# Patient Record
Sex: Male | Born: 1965 | Race: White | Hispanic: No | Marital: Married | Smoking: Never smoker
Health system: Southern US, Community
[De-identification: ages and names within clinical notes are randomized; demographics above are authoritative.]

## PROBLEM LIST (undated history)

## (undated) DIAGNOSIS — I10 Essential (primary) hypertension: Secondary | ICD-10-CM

## (undated) DIAGNOSIS — G35 Multiple sclerosis: Secondary | ICD-10-CM

## (undated) DIAGNOSIS — G969 Disorder of central nervous system, unspecified: Secondary | ICD-10-CM

## (undated) DIAGNOSIS — D8689 Sarcoidosis of other sites: Secondary | ICD-10-CM

## (undated) DIAGNOSIS — E785 Hyperlipidemia, unspecified: Secondary | ICD-10-CM

## (undated) DIAGNOSIS — N529 Male erectile dysfunction, unspecified: Secondary | ICD-10-CM

## (undated) DIAGNOSIS — F329 Major depressive disorder, single episode, unspecified: Secondary | ICD-10-CM

## (undated) DIAGNOSIS — E291 Testicular hypofunction: Secondary | ICD-10-CM

## (undated) DIAGNOSIS — R251 Tremor, unspecified: Secondary | ICD-10-CM

## (undated) DIAGNOSIS — F32A Depression, unspecified: Secondary | ICD-10-CM

## (undated) DIAGNOSIS — M1990 Unspecified osteoarthritis, unspecified site: Secondary | ICD-10-CM

## (undated) DIAGNOSIS — G4730 Sleep apnea, unspecified: Secondary | ICD-10-CM

## (undated) DIAGNOSIS — K5909 Other constipation: Secondary | ICD-10-CM

## (undated) DIAGNOSIS — A078 Other specified protozoal intestinal diseases: Secondary | ICD-10-CM

## (undated) HISTORY — DX: Sleep apnea, unspecified: G47.30

## (undated) HISTORY — DX: Hyperlipidemia, unspecified: E78.5

## (undated) HISTORY — DX: Male erectile dysfunction, unspecified: N52.9

## (undated) HISTORY — DX: Depression, unspecified: F32.A

## (undated) HISTORY — DX: Major depressive disorder, single episode, unspecified: F32.9

## (undated) HISTORY — DX: Other constipation: K59.09

## (undated) HISTORY — DX: Morbid (severe) obesity due to excess calories: E66.01

## (undated) HISTORY — DX: Sarcoidosis of other sites: D86.89

## (undated) HISTORY — DX: Multiple sclerosis: G35

## (undated) HISTORY — DX: Testicular hypofunction: E29.1

## (undated) HISTORY — DX: Essential (primary) hypertension: I10

---

## 1997-12-05 HISTORY — PX: OTHER SURGICAL HISTORY: SHX169

## 2005-04-19 ENCOUNTER — Ambulatory Visit: Payer: Self-pay | Admitting: Internal Medicine

## 2005-05-23 ENCOUNTER — Ambulatory Visit: Payer: Self-pay | Admitting: Nurse Practitioner

## 2005-12-05 HISTORY — PX: TONSILLECTOMY: SUR1361

## 2006-03-15 ENCOUNTER — Ambulatory Visit: Payer: Self-pay | Admitting: Specialist

## 2006-04-20 ENCOUNTER — Ambulatory Visit (HOSPITAL_COMMUNITY): Admission: RE | Admit: 2006-04-20 | Discharge: 2006-04-20 | Payer: Self-pay | Admitting: Preventative Medicine

## 2006-04-27 ENCOUNTER — Ambulatory Visit: Payer: Self-pay | Admitting: Family Medicine

## 2006-05-11 ENCOUNTER — Other Ambulatory Visit: Payer: Self-pay

## 2006-05-19 ENCOUNTER — Ambulatory Visit: Payer: Self-pay | Admitting: Unknown Physician Specialty

## 2006-09-20 ENCOUNTER — Ambulatory Visit: Payer: Self-pay | Admitting: Psychiatry

## 2010-07-30 ENCOUNTER — Encounter: Admission: RE | Admit: 2010-07-30 | Discharge: 2010-07-30 | Payer: Self-pay | Admitting: Neurology

## 2011-12-06 HISTORY — PX: LUNG BIOPSY: SHX232

## 2011-12-22 ENCOUNTER — Ambulatory Visit: Payer: Self-pay | Admitting: Gastroenterology

## 2011-12-23 LAB — PATHOLOGY REPORT

## 2012-03-19 ENCOUNTER — Ambulatory Visit: Payer: Self-pay | Admitting: Oncology

## 2012-04-04 ENCOUNTER — Ambulatory Visit: Payer: Self-pay | Admitting: Oncology

## 2012-04-12 ENCOUNTER — Ambulatory Visit: Payer: Self-pay

## 2012-04-12 LAB — COMPREHENSIVE METABOLIC PANEL
Albumin: 4.1 g/dL (ref 3.4–5.0)
Alkaline Phosphatase: 63 U/L (ref 50–136)
Anion Gap: 7 (ref 7–16)
Calcium, Total: 9.7 mg/dL (ref 8.5–10.1)
EGFR (African American): >60
EGFR (Non-African Amer.): >60
Glucose: 102 mg/dL — ABNORMAL HIGH (ref 65–99)
Osmolality: 279 (ref 275–301)
Potassium: 3.8 mmol/L (ref 3.5–5.1)
SGPT (ALT): 82 U/L — ABNORMAL HIGH
Sodium: 139 mmol/L (ref 136–145)

## 2012-04-12 LAB — CBC CANCER CENTER
Basophil #: 0 x10 3/mm (ref 0.0–0.1)
MCH: 29.7 pg (ref 26.0–34.0)
MCHC: 33.1 g/dL (ref 32.0–36.0)
Monocyte %: 12.8 %
Neutrophil #: 3.5 x10 3/mm (ref 1.4–6.5)
Neutrophil %: 65.1 %
RDW: 13 % (ref 11.5–14.5)
WBC: 5.4 x10 3/mm (ref 3.8–10.6)

## 2012-04-12 LAB — APTT: Activated PTT: 28.3 secs (ref 23.6–35.9)

## 2012-04-12 LAB — PROTIME-INR: Prothrombin Time: 12.9 secs (ref 11.5–14.7)

## 2012-04-16 ENCOUNTER — Inpatient Hospital Stay: Payer: Self-pay

## 2012-04-17 LAB — CBC WITH DIFFERENTIAL/PLATELET
Basophil #: 0 10*3/uL (ref 0.0–0.1)
HGB: 14.2 g/dL (ref 13.0–18.0)
Lymphocyte #: 0.9 10*3/uL — ABNORMAL LOW (ref 1.0–3.6)
MCHC: 33 g/dL (ref 32.0–36.0)
MCV: 91 fL (ref 80–100)
Monocyte %: 10.8 %
Neutrophil #: 7.8 10*3/uL — ABNORMAL HIGH (ref 1.4–6.5)
Neutrophil %: 79.1 %
RDW: 13 % (ref 11.5–14.5)

## 2012-04-20 LAB — WOUND CULTURE

## 2012-05-05 ENCOUNTER — Ambulatory Visit: Payer: Self-pay | Admitting: Oncology

## 2012-05-07 LAB — CULTURE, FUNGUS WITHOUT SMEAR

## 2012-06-04 ENCOUNTER — Ambulatory Visit: Payer: Self-pay | Admitting: Oncology

## 2012-10-04 ENCOUNTER — Ambulatory Visit: Payer: Self-pay | Admitting: Oncology

## 2012-10-18 ENCOUNTER — Ambulatory Visit: Payer: Self-pay | Admitting: Oncology

## 2012-11-04 ENCOUNTER — Ambulatory Visit: Payer: Self-pay | Admitting: Oncology

## 2012-12-06 ENCOUNTER — Other Ambulatory Visit: Payer: Self-pay | Admitting: Psychiatry

## 2012-12-06 DIAGNOSIS — G35 Multiple sclerosis: Secondary | ICD-10-CM

## 2012-12-06 DIAGNOSIS — G959 Disease of spinal cord, unspecified: Secondary | ICD-10-CM

## 2012-12-06 DIAGNOSIS — D869 Sarcoidosis, unspecified: Secondary | ICD-10-CM

## 2012-12-14 ENCOUNTER — Ambulatory Visit
Admission: RE | Admit: 2012-12-14 | Discharge: 2012-12-14 | Disposition: A | Discharge status: Home / Self Care | Payer: Medicare Other | Source: Ambulatory Visit | Attending: Psychiatry | Admitting: Psychiatry

## 2012-12-14 DIAGNOSIS — G35 Multiple sclerosis: Secondary | ICD-10-CM

## 2012-12-14 DIAGNOSIS — G959 Disease of spinal cord, unspecified: Secondary | ICD-10-CM

## 2012-12-14 DIAGNOSIS — D869 Sarcoidosis, unspecified: Secondary | ICD-10-CM

## 2012-12-14 MED ORDER — GADOBENATE DIMEGLUMINE 529 MG/ML IV SOLN
20.0000 mL | Freq: Once | INTRAVENOUS | Status: AC | PRN
  Administered 2012-12-14: 20 mL via INTRAVENOUS

## 2013-02-08 ENCOUNTER — Emergency Department: Payer: Self-pay | Admitting: Emergency Medicine

## 2013-11-11 ENCOUNTER — Other Ambulatory Visit: Payer: Self-pay | Admitting: Specialist

## 2013-11-11 DIAGNOSIS — M5416 Radiculopathy, lumbar region: Secondary | ICD-10-CM

## 2013-11-22 ENCOUNTER — Other Ambulatory Visit: Payer: Medicare Other

## 2013-11-30 ENCOUNTER — Ambulatory Visit
Admission: RE | Admit: 2013-11-30 | Discharge: 2013-11-30 | Disposition: A | Discharge status: Home / Self Care | Payer: Medicare Other | Source: Ambulatory Visit | Attending: Specialist | Admitting: Specialist

## 2013-11-30 DIAGNOSIS — M5416 Radiculopathy, lumbar region: Secondary | ICD-10-CM

## 2013-12-05 HISTORY — PX: OTHER SURGICAL HISTORY: SHX169

## 2014-04-22 ENCOUNTER — Other Ambulatory Visit: Payer: Self-pay | Admitting: Nurse Practitioner

## 2014-04-22 DIAGNOSIS — M1990 Unspecified osteoarthritis, unspecified site: Secondary | ICD-10-CM | POA: Insufficient documentation

## 2014-04-22 DIAGNOSIS — M25562 Pain in left knee: Secondary | ICD-10-CM

## 2014-04-22 DIAGNOSIS — S8990XA Unspecified injury of unspecified lower leg, initial encounter: Secondary | ICD-10-CM | POA: Insufficient documentation

## 2014-05-02 ENCOUNTER — Ambulatory Visit
Admission: RE | Admit: 2014-05-02 | Discharge: 2014-05-02 | Disposition: A | Discharge status: Home / Self Care | Payer: Commercial Managed Care - HMO | Source: Ambulatory Visit | Attending: Nurse Practitioner | Admitting: Nurse Practitioner

## 2014-05-02 DIAGNOSIS — M25562 Pain in left knee: Secondary | ICD-10-CM

## 2014-06-13 DIAGNOSIS — D8689 Sarcoidosis of other sites: Secondary | ICD-10-CM | POA: Insufficient documentation

## 2014-06-13 DIAGNOSIS — G4733 Obstructive sleep apnea (adult) (pediatric): Secondary | ICD-10-CM | POA: Insufficient documentation

## 2014-06-16 ENCOUNTER — Ambulatory Visit: Payer: Self-pay | Admitting: Orthopedic Surgery

## 2014-06-19 ENCOUNTER — Ambulatory Visit: Payer: Self-pay | Admitting: Orthopedic Surgery

## 2015-03-28 NOTE — Op Note (Signed)
PATIENT NAME:  Darren Taylor, Darren Taylor MR#:  [id number] DATE OF BIRTH:  27-Mar-1966  DATE OF PROCEDURE:  06/19/2014  PREOPERATIVE DIAGNOSIS: Left knee medial meniscus tear.   POSTOPERATIVE DIAGNOSIS: Left knee medial meniscus tear with medial compartment arthritis.   PROCEDURE: Arthroscopy, left knee with partial medial meniscectomy.   ANESTHESIA: General.   SURGEON: Hessie Knows, M.D.   DESCRIPTION OF PROCEDURE: The patient was brought to the Operating Room, and after adequate anesthesia was obtained, the left leg was placed in the arthroscopic leg holder with the leg prepped and draped in the usual sterile fashion. After patient identification and timeout procedures were completed, an inferolateral portal was made and the arthroscope was introduced. Initial inspection revealed some mild central chondromalacia of the patella. The medial and lateral gutters were free of any loose body. Coming out of the medial compartment, an inferomedial portal was made and a probe introduced.  There was some articular full thickness cartilage loss on the femoral condyle with fissuring over an extensive area. There was a flap tear of the posterior horn that could be displaced into the joint along with a complex tear involving the posterior third of the meniscus. This was debrided back with a meniscal punch followed by an ArthroCare wand. The ACL was intact and the lateral compartment was essentially normal. It was a very small area of grade I chondromalacia on the tibial condyle. After thorough irrigation of the joint, all instrumentation was withdrawn. The wounds were closed with simple interrupted 4-0 nylon,  20 mL of 0.5% Sensorcaine with epinephrine infiltrated into get into the area of the portals for postoperative analgesia. Xeroform, 4 x 4's, Webril and Ace wrap were applied. The patient was sent to the recovery room in stable condition.   ESTIMATED BLOOD LOSS: Minimal.   COMPLICATIONS: None.   SPECIMEN:  None.  Pre- and postprocedure arthroscopic pictures were obtained.   ____________________________ Laurene Footman, MD mjm:ts D: 06/19/2014 17:53:42 ET T: 06/19/2014 18:46:24 ET JOB#: [id number]  cc: Laurene Footman, MD, <Dictator> Laurene Footman MD ELECTRONICALLY SIGNED 06/23/2014 22:30

## 2015-03-29 NOTE — Discharge Summary (Signed)
PATIENT NAME:  [name], [name] MR#:  [id number] DATE OF BIRTH:  December 27, 1965  DATE OF ADMISSION:  04/16/2012 DATE OF DISCHARGE:  04/18/2012  ADMITTING DIAGNOSIS: Mediastinal lymphadenopathy.   DISCHARGE DIAGNOSIS: Mediastinal lymphadenopathy.  OPERATION PERFORMED: Left anterior thoracotomy with biopsy and mediastinal nodes.   HOSPITAL COURSE: Mr. [name] is a 49 year old gentleman who was recently found to have extensive mediastinal adenopathy. He had a CT scan revealing marked enlargement of the mediastinal nodes and there was some concern that these may represent a malignancy. Because of this he was offered left anterior thoracotomy and biopsy. He was admitted to the hospital on 04/16/2012 where he underwent this procedure. The next day his chest tube was removed and the following day he was felt to be suitable for discharge. Because of his multiple sclerosis, he did have some issues with ambulation and rehabilitation. By the second day postoperatively he was felt ready for discharge.   DISCHARGE MEDICATIONS: His discharge medications included all of the same home medications plus he was given a prescription for Percocet to take as needed.   FOLLOW-UP: He will follow-up with me in one week in the [facility].   PATHOLOGY: At the time of discharge pathology is still pending.   ____________________________ Lew Dawes. Genevive Bi, MD teo:drc D: 04/19/2012 11:57:14 ET T: 04/19/2012 12:05:12 ET JOB#: [id number]  cc: Christia Reading E. Genevive Bi, MD, <Dictator> Louis Matte MD ELECTRONICALLY SIGNED 04/24/2012 8:21

## 2015-03-29 NOTE — Consult Note (Signed)
History of Present Illness:   Reason for Consult Abnormal CT scan of the chest and abdomen (March 02, 2012) findings suggestive of 3 mm right middle lobe nodule.  1.2 cm left articular lymph node.  1.4 cm portocaval lymph node.  1 cm right iliac lymph node.  CT scan of the chest (the 5th April, 2013) numerous enlarged subcentimeter lymph node along with multiple mediastinal lymph node.  1.2 cm right supraclavicular lymph node 1.4 cm precarinal lymph node.  Multiple bilateral pulmonary nodules.    Date of Diagnosis 09-Mar-2012    HPI   49 year old moderately obese  individual started having rectal bleeding.  Patient had  fissure  was evaluated by gastroenterology CT scan of the abdomen was ordered.  Had a colonoscopy and according to him polyp in the colon was found.Marland Kitchenscan of abdomen and was abnormal with portacaval and peripancreatic lymph node CT scan of the chest was ordered which revealed multiple pulmonary nodules and multiple mediastinal lymphadenopathy.  Does not have any fever.  Has dry hacking cough.  Patient does not smoke.  Has multiple sclerosis diagnosed approximately year ago and is getting  COPAXON .   Patient is extremely apprehensive and anxious  PFSH:   Comments Family history of heart disease and prostate cancer    Comments Patient does not smoke.  Was working  [employer].   Does not smoke.  Does not drink.    Additional Past Medical and Surgical History Multiple sclerosis.   Hypertension.   Sleep apnea  Hyperlipidemia   Coronary artery disease   Review of Systems:   General weakness  fatigue    Performance Status (ECOG) 1    HEENT Had surgery for sleep apnea (uvuloplasty)    Lungs cough  SOB    Cardiac no complaints    GI Rectal bleeding    GU no complaints    Musculoskeletal muscle ache    Extremities no complaints    Skin no complaints    Neuro History of multiple sclerosis    Endocrine no complaints    Psych anxiety    Review of  Systems   as per history of present illness  NURSING NOTES: *CC Vital Signs Flowsheet:   15-Apr-13 14:58    Temp Temperature: 98.4    Pulse Pulse: 71    Respirations Respirations: 20    SBP SBP: 121    DBP DBP: 78    Pain Scale (0-10): 6    Current Weight (kg) (kg): 150.8    Height (cm) centimeters: 184.4    BSA (m2): 2.6   Physical Exam:   General Patient is alert and oriented extremely apprehensive    HEENT: Uvuloplasty    Lungs: clear    Cardiac: regular rate, rhythm    Abdomen: soft  nontender  positive bowel sounds    Skin: intact    Musculoskeletal: No abnormality detected    Extremities: No edema, rash or cyanosis    Neuro: AAOx3  cranial nerves intact    Psych: mood agitated    Physical Exam LYMPHATICS:   No cervical, axillary, or inguinal lymphadenopathy     chronic constipation:    Multiple Sclerosis: 2012   Depression:    Sleep Apnea:    Hypercholesterolemia:    Hypertension:    hyperten:    Hyperlipidemia:    finger surgery:    forearm surgery:    No Known Allergies:     hydrochlorothiazide-lisinopril 12.5 mg-20 mg oral tablet: 1 tab(s) orally once a day,  Active, 0, None   stool softener:   , As Needed- for Constipation , Active, 0, None   Metamucil 3.4 g/5.2 g oral powder for reconstitution:  orally 2-3 times daily, Active, 0, None   alprazolam 0.5 mg oral tablet: 1 tab(s) orally , As Needed, Active, 0, None   albuterol:  orally , As Needed, Active, 0, None   Copaxone 20 mg/mL subcutaneous kit: 1 kit subcutaneous once a day, Active, 0, None   Pristiq 100 mg oral tablet, extended release: 1 tab(s) orally once a day, Active, 0, None   gabapentin 300 mg oral capsule: 1 cap(s) orally 2 times a day, Active, 0, None   MiraLax oral powder for reconstitution: 1 dose(s) orally once a day, Active, 0, None   multivitamin: 1 cap(s) orally once a day, Active, 0, None   nitroglycerin:  topically 2 times a day, Active, 0,  None   topiramate 25 mg oral tablet: 1 tab(s) orally 2 times a day, As Needed- for Headache , Active, 0, None   trazodone 100 mg oral tablet: 2 tab(s) orally once a day (at bedtime), Active, 0, None  Assessment and Plan:  Impression:   [age] year old gentleman had a CT scan of chest abdomen and pelvis revealing multiple enlarged lymph node and multiple pulmonary nodules.  Most and lymph nodes are less than 2 cm and subcentimeter Etiology could be sarcoidosis versus lymphoma I have ordered ACE.if it is negative then PET scan and biopsy can be arranged patient is going to be seen by Dr. Faith Rogue, cardiothoracic surgeon. Has multiple sclerosis on Copaxone. Multiple other medical illness including sleep apnea syndrome: Obesity: Hypercholesterolemia, hypertensionafter ACE blood test  is available  CC Referral:   cc: Dr. Maryland Pink.  Ebony Cargo.  NP  gastroenterology Department, [facility]   Electronic Signatures: Jobe Gibbon (MD)  (Signed 15-Apr-13 17:07)  Authored: HISTORY OF PRESENT ILLNESS, PFSH, ROS, NURSING NOTES, PE, PAST MEDICAL HISTORY, ALLERGIES, HOME MEDICATIONS, ASSESSMENT AND PLAN, CC Referring Physician   Last Updated: 15-Apr-13 17:07 by Jobe Gibbon (MD)

## 2015-03-29 NOTE — Op Note (Signed)
PATIENT NAME:  Darren Taylor, Darren Taylor MR#:  [id number] DATE OF BIRTH:  10-Jun-1966  DATE OF PROCEDURE:  04/16/2012  PREOPERATIVE DIAGNOSIS: Mediastinal lymphadenopathy.   POSTOPERATIVE DIAGNOSIS: Mediastinal lymphadenopathy.   OPERATION PERFORMED:  1. Preoperative bronchoscopy to assess endobronchial anatomy.  2. Left anterior thoracotomy (Chamberlain procedure) with biopsy of mediastinal lymph nodes.   SURGEON: Bryson Palen E. Genevive Bi, MD   ASSISTANT: Rodena Goldmann, III, MD   INDICATIONS FOR PROCEDURE: [name] is a 49 year old gentleman who was recently discovered to have extensive mediastinal adenopathy. He also carries a diagnosis of multiple sclerosis, and he was offered the above-named procedure for definitive diagnosis. The indications and risks were explained to the patient, who gave his informed consent.   DESCRIPTION OF PROCEDURE: The patient was brought to the operating suite and placed in the supine position. General endotracheal anesthesia was given through a double-lumen tube. Preoperative bronchoscopy was carried out. There was no evidence of endobronchial tumor or mass. The mucosa looked normal. There was no studding or other abnormalities of the mucosa. The patient was then positioned supine for an anterior thoracotomy. The patient was prepped and draped in the usual sterile fashion. An incision was made overlying the second rib and carried down through the muscles of the chest wall until the second rib was identified. A portion of the second rib was removed allowing [place] good access to the mediastinum. We opened the mediastinal pleura and could palpate several very firm lymph nodes within the mediastinum. These were grasped with an Allis clamp and elevated out of the mediastinal fat. A portion of these nodes were removed. There were actually two lymph nodes each measuring about a centimeter in size. One was sent for frozen section and flow cytometry. A portion of one node was sent for culture.  Hemostasis was complete. Frozen section returned probable granulomatous inflammation. The chest was drained with a #19 Blake. It was brought out through a separate stab wound laterally. The wounds were then closed in multiple layers of running absorbable sutures. The muscles, subcutaneous tissues and skin were all closed. Benzoin and Steri-Strips completed the case.  The patient was then awakened from general endotracheal anesthesia and taken to the recovery room in stable condition.    ____________________________ Lew Dawes Genevive Bi, MD teo:cbb D: 04/16/2012 09:21:26 ET T: 04/16/2012 15:52:09 ET JOB#: [id number]  cc: Christia Reading E. Genevive Bi, MD, <Dictator> Rodena Goldmann III, MD Martie Lee. Oliva Bustard, MD Louis Matte MD ELECTRONICALLY SIGNED 04/24/2012 8:21

## 2015-09-22 ENCOUNTER — Ambulatory Visit: Payer: Self-pay | Admitting: Urology

## 2015-10-09 ENCOUNTER — Encounter: Payer: Self-pay | Admitting: Urology

## 2015-10-09 ENCOUNTER — Ambulatory Visit (INDEPENDENT_AMBULATORY_CARE_PROVIDER_SITE_OTHER): Payer: Commercial Managed Care - HMO | Admitting: Urology

## 2015-10-09 VITALS — BP 130/82 | HR 80 | Ht 75.0 in | Wt 316.9 lb

## 2015-10-09 DIAGNOSIS — F524 Premature ejaculation: Secondary | ICD-10-CM

## 2015-10-09 DIAGNOSIS — N521 Erectile dysfunction due to diseases classified elsewhere: Secondary | ICD-10-CM | POA: Diagnosis not present

## 2015-10-09 NOTE — Progress Notes (Signed)
Patient well known to me. He is on Lexapro for premature ejaculation is able to [facility] for 15 minutes. He is also on sildenafil 50 mg she takes 3 when he had presented. Plan to maintain this. 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 He recently had a bout of knee pain from this problem. 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 He was put on acyclovir for this lung breath other than that he is happy with his sex life now and his going to have visit with [place] in 6 months for removal of medication and rectal exam were his prostate along with a PSA.

## 2016-01-21 DIAGNOSIS — E782 Mixed hyperlipidemia: Secondary | ICD-10-CM | POA: Insufficient documentation

## 2016-01-21 DIAGNOSIS — N529 Male erectile dysfunction, unspecified: Secondary | ICD-10-CM | POA: Insufficient documentation

## 2016-01-21 DIAGNOSIS — I10 Essential (primary) hypertension: Secondary | ICD-10-CM | POA: Insufficient documentation

## 2016-03-15 ENCOUNTER — Other Ambulatory Visit: Payer: Self-pay

## 2016-03-15 DIAGNOSIS — F524 Premature ejaculation: Secondary | ICD-10-CM

## 2016-03-15 MED ORDER — ESCITALOPRAM OXALATE 10 MG PO TABS: 10.0000 mg | ORAL_TABLET | Freq: Every day | ORAL | Status: DC

## 2016-04-22 ENCOUNTER — Ambulatory Visit: Payer: Commercial Managed Care - HMO

## 2016-05-18 ENCOUNTER — Ambulatory Visit: Payer: Commercial Managed Care - HMO | Admitting: Urology

## 2016-06-24 ENCOUNTER — Encounter: Payer: Self-pay | Admitting: Urology

## 2016-06-24 ENCOUNTER — Ambulatory Visit (INDEPENDENT_AMBULATORY_CARE_PROVIDER_SITE_OTHER): Payer: Medicare PPO | Admitting: Urology

## 2016-06-24 VITALS — BP 133/84 | HR 71 | Ht 75.0 in | Wt 326.0 lb

## 2016-06-24 DIAGNOSIS — N521 Erectile dysfunction due to diseases classified elsewhere: Secondary | ICD-10-CM | POA: Diagnosis not present

## 2016-06-24 DIAGNOSIS — R6882 Decreased libido: Secondary | ICD-10-CM

## 2016-06-24 DIAGNOSIS — N471 Phimosis: Secondary | ICD-10-CM

## 2016-06-24 DIAGNOSIS — F524 Premature ejaculation: Secondary | ICD-10-CM

## 2016-06-24 NOTE — Progress Notes (Signed)
06/24/2016 8:37 AM   Darren Taylor 04/23/1966 [id number]  Referring provider: Maryland Pink, MD [address] [facility] [place], [place] [address]  Chief Complaint  Patient presents with  . Erectile Dysfunction    11month    HPI: 50 year old male previously followed by Dr. Elnoria Howard for erectile dysfunction and premature ejaculation.  He has multiple medical comorbidities including neurogenic sarcoidosis and depression.  In terms of premature ejaculation, he is taking Lexapro daily for this and has had fairly decent success with this.  He is currently using sildenafil 80 mg (generic) which works OK but not to his complete satisfaction. He is able to achieve an erection with this most of the time that often not maintained. He also is concerned about the limitations of spontaneity with Viagra and must plan to take this medication prior to a sexual encounter. Reliability of the medication is variable. He does try to take it on an empty stomach at least 1 hour prior sexual activity.  He is anxious for Cialis to come out as a generic so that it is more affordable.    He also complains today that his interest in sexual activity is lower at times. He had his testosterone checked in the past and this was presumably normal per his report.  He is also concerned today about extra skin on his penis which sometimes rolls forward and causes straying with urination.            [place] Name 06/24/16 0943         SHIM: Over the last 6 months:   How do you rate your confidence that you could get and keep an erection? Low     When you had erections with sexual stimulation, how often were your erections hard enough for penetration (entering your partner)? Sometimes (about half the time)     During sexual intercourse, how often were you able to maintain your erection after you had penetrated (entered) your partner? Difficult     During sexual intercourse, how difficult was it to maintain  your erection to completion of intercourse? Slightly Difficult     When you attempted sexual intercourse, how often was it satisfactory for you? Slightly Difficult       SHIM Total Score   SHIM 16         PMH: Past Medical History:  Diagnosis Date  . Chronic constipation   . Depression   . ED (erectile dysfunction)   . Hyperlipemia   . Hypertension   . Hypogonadism in male   . Morbid obesity ([place])   . MS (multiple sclerosis) ([place])   . Neurosarcoidosis ([place])   . Sleep apnea     Surgical History: Past Surgical History:  Procedure Laterality Date  . Arm Fracture Repair  1999  . LUNG BIOPSY  2013  . Repair of Meniscus  2015  . TONSILLECTOMY  2007    Home Medications:    Medication List       Accurate as of 06/24/16 11:59 PM. Always use your most recent med list.          ALPRAZolam 0.5 MG tablet Commonly known as:  XANAX   buPROPion 300 MG 24 hr tablet Commonly known as:  WELLBUTRIN XL   escitalopram 10 MG tablet Commonly known as:  LEXAPRO Take 1 tablet (10 mg total) by mouth daily.   gabapentin 300 MG capsule Commonly known as:  NEURONTIN   HYDROcodone-acetaminophen 5-325 MG  tablet Commonly known as:  NORCO/VICODIN Take by mouth.   hydroxychloroquine 200 MG tablet Commonly known as:  PLAQUENIL   levETIRAcetam 500 MG tablet Commonly known as:  KEPPRA Take 500 mg by mouth 2 (two) times daily.   lisinopril-hydrochlorothiazide 20-25 MG tablet Commonly known as:  PRINZIDE,ZESTORETIC TAKE 1 TABLET TWICE DAILY   methylphenidate 20 MG tablet Commonly known as:  RITALIN   PROAIR HFA 108 (90 Base) MCG/ACT inhaler Generic drug:  albuterol Inhale into the lungs.   sildenafil 20 MG tablet Commonly known as:  REVATIO TAKE 2-5 TABLETS (40-100 MG TOTAL) BY MOUTH AS NEEDED.   simvastatin 20 MG tablet Commonly known as:  ZOCOR   temazepam 15 MG capsule Commonly known as:  RESTORIL Take 15 mg by mouth.   traMADol 50 MG tablet Commonly known as:   ULTRAM   traZODone 100 MG tablet Commonly known as:  DESYREL Take by mouth.       Allergies: No Known Allergies  Family History: Family History  Problem Relation Age of Onset  . Bladder Cancer Neg Hx   . Prostate cancer Neg Hx   . Heart disease Mother     Social History:  reports that he has never smoked. He does not have any smokeless tobacco history on file. He reports that he drinks alcohol. He reports that he does not use drugs.  ROS: UROLOGY Frequent Urination?: No Hard to postpone urination?: No Burning/pain with urination?: No Get up at night to urinate?: No Leakage of urine?: No Urine stream starts and stops?: No Trouble starting stream?: No Do you have to strain to urinate?: No Blood in urine?: No Urinary tract infection?: No Sexually transmitted disease?: No Injury to kidneys or bladder?: No Painful intercourse?: No Weak stream?: Yes Erection problems?: Yes Penile pain?: No  Gastrointestinal Nausea?: No Vomiting?: No Indigestion/heartburn?: No Diarrhea?: No Constipation?: No  Constitutional Fever: No Night sweats?: No Weight loss?: No Fatigue?: Yes  Skin Skin rash/lesions?: Yes Itching?: No  Eyes Blurred vision?: No Double vision?: No  Ears/Nose/Throat Sore throat?: No Sinus problems?: No  Hematologic/Lymphatic Swollen glands?: No Easy bruising?: No  Cardiovascular Leg swelling?: No Chest pain?: No  Respiratory Cough?: No Shortness of breath?: Yes  Endocrine Excessive thirst?: No  Musculoskeletal Back pain?: Yes Joint pain?: Yes  Neurological Headaches?: Yes Dizziness?: No  Psychologic Depression?: Yes Anxiety?: Yes  Physical Exam: BP 133/84   Pulse 71   Ht 6\' 3"  (1.905 m)   Wt (!) 326 lb (147.9 kg)   BMI 40.75 kg/m    Constitutional:  Alert and oriented, No acute distress. HEENT: [place] AT, moist mucus membranes.  Trachea midline, no masses. Cardiovascular: No clubbing, cyanosis, or edema. Respiratory: Normal  respiratory effort, no increased work of breathing. GI: Abdomen is soft, nontender, nondistended, no abdominal masses GU: No CVA tenderness. Skin: No rashes, bruises or suspicious lesions. Lymph: No cervical or inguinal adenopathy. Neurologic: Grossly intact, no focal deficits, moving all 4 extremities. Psychiatric: Normal mood and affect.  Laboratory Data: Lab Results  Component Value Date   WBC 9.9 04/17/2012   HGB 14.2 04/17/2012   HCT 43.1 04/17/2012   MCV 91 04/17/2012   PLT 251 04/17/2012    Lab Results  Component Value Date   CREATININE 1.09 04/12/2012     Assessment & Plan:    1. Erectile dysfunction due to diseases classified elsewhere Continue generic Viagra, may increase dose to 100 mg as needed We discussed his alternative options including a vacuum erectile device,  intracavernosal injection/MUSE, and IPP He is very interested in Cialis but cannot afford this medication Given his refractory ED, we did have a lengthy discussion today about intracavernosal injections. He like to think about this and discuss it with his partner. 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  2. Premature ejaculation Continue Lexapro  3. Decreased libido Presumably normal testosterone the past May be related to underlying medical condition including neurosarcoidosis and depression  4. Phimosis Complains of redundant foreskin despite being circumcised. He is able to retract this and has no issues with infection. We did discuss that this is very worrisome or bothersome to him, we could plan for circumcision revision.   Return if symptoms worsen or fail to improve.  Hollice Espy, MD  [facility] [address] [place], [place] [address] [phone]

## 2016-06-24 NOTE — Patient Instructions (Signed)
Erectile Dysfunction  Erectile dysfunction is the inability to get or sustain a good enough erection to have sexual intercourse. Erectile dysfunction may involve:   Inability to get an erection.   Lack of enough hardness to allow penetration.   Loss of the erection before sex is finished.   Premature ejaculation.  CAUSES   Certain drugs, such as:    Pain relievers.    Antihistamines.    Antidepressants.    Blood pressure medicines.    Water pills (diuretics).    Ulcer medicines.    Muscle relaxants.    Illegal drugs.   Excessive drinking.   Psychological causes, such as:    Anxiety.    Depression.    Sadness.    Exhaustion.    Performance fear.    Stress.   Physical causes, such as:    Artery problems. This may include diabetes, smoking, liver disease, or atherosclerosis.    High blood pressure.    Hormonal problems, such as low testosterone.    Obesity.    Nerve problems. This may include back or pelvic injuries, diabetes mellitus, multiple sclerosis, or Parkinson disease.  SYMPTOMS   Inability to get an erection.   Lack of enough hardness to allow penetration.   Loss of the erection before sex is finished.   Premature ejaculation.   Normal erections at some times, but with frequent unsatisfactory episodes.   Orgasms that are not satisfactory in sensation or frequency.   Low sexual satisfaction in either partner because of erection problems.   A curved penis occurring with erection. The curve may cause pain or may be too curved to allow for intercourse.   Never having nighttime erections.  DIAGNOSIS  Your caregiver can often diagnose this condition by:   Performing a physical exam to find other diseases or specific problems with the penis.   Asking you detailed questions about the problem.   Performing blood tests to check for diabetes mellitus or to measure hormone levels.   Performing urine tests to find other underlying health conditions.   Performing an ultrasound exam to check for  scarring.   Performing a test to check blood flow to the penis.   Doing a sleep study at home to measure nighttime erections.  TREATMENT    You may be prescribed medicines by mouth.   You may be given medicine injections into the penis.   You may be prescribed a vacuum pump with a ring.   Penile implant surgery may be performed. You may receive:    An inflatable implant.    A semirigid implant.   Blood vessel surgery may be performed.  HOME CARE INSTRUCTIONS   If you are prescribed oral medicine, you should take the medicine as prescribed. Do not increase the dosage without first discussing it with your physician.   If you are using self-injections, be careful to avoid any veins that are on the surface of the penis. Apply pressure to the injection site for 5 minutes.   If you are using a vacuum pump, make sure you have read the instructions before using it. Discuss any questions with your physician before taking the pump home.  SEEK MEDICAL CARE IF:   You experience pain that is not responsive to the pain medicine you have been prescribed.   You experience nausea or vomiting.  SEEK IMMEDIATE MEDICAL CARE IF:    When taking oral or injectable medications, you experience an erection that lasts longer than 4 hours. If your   physician is unavailable, go to the nearest emergency room for evaluation. An erection that lasts much longer than 4 hours can result in permanent damage to your penis.   You have pain that is severe.   You develop redness, severe pain, or severe swelling of your penis.   You have redness spreading up into your groin or lower abdomen.   You are unable to pass your urine.     This information is not intended to replace advice given to you by your health care provider. Make sure you discuss any questions you have with your health care provider.     Document Released: 11/18/2000 Document Revised: 07/24/2013 Document Reviewed: 04/25/2013  Elsevier Interactive Patient Education 2016  [employer]

## 2016-12-14 DIAGNOSIS — F331 Major depressive disorder, recurrent, moderate: Secondary | ICD-10-CM | POA: Diagnosis not present

## 2016-12-14 DIAGNOSIS — Z79899 Other long term (current) drug therapy: Secondary | ICD-10-CM | POA: Diagnosis not present

## 2016-12-14 DIAGNOSIS — F411 Generalized anxiety disorder: Secondary | ICD-10-CM | POA: Diagnosis not present

## 2016-12-23 ENCOUNTER — Other Ambulatory Visit: Payer: Self-pay | Admitting: Psychiatry

## 2016-12-23 DIAGNOSIS — D869 Sarcoidosis, unspecified: Secondary | ICD-10-CM

## 2016-12-30 ENCOUNTER — Ambulatory Visit
Admission: RE | Admit: 2016-12-30 | Discharge: 2016-12-30 | Disposition: A | Discharge status: Home / Self Care | Payer: PPO | Source: Ambulatory Visit | Attending: Psychiatry | Admitting: Psychiatry

## 2016-12-30 ENCOUNTER — Other Ambulatory Visit: Payer: Self-pay | Admitting: Psychiatry

## 2016-12-30 ENCOUNTER — Ambulatory Visit
Admission: RE | Admit: 2016-12-30 | Discharge: 2016-12-30 | Disposition: A | Discharge status: Home / Self Care | Payer: Self-pay | Source: Ambulatory Visit | Attending: Psychiatry | Admitting: Psychiatry

## 2016-12-30 DIAGNOSIS — D869 Sarcoidosis, unspecified: Secondary | ICD-10-CM

## 2016-12-30 DIAGNOSIS — R9082 White matter disease, unspecified: Secondary | ICD-10-CM | POA: Diagnosis not present

## 2016-12-30 MED ORDER — GADOBENATE DIMEGLUMINE 529 MG/ML IV SOLN
20.0000 mL | Freq: Once | INTRAVENOUS | Status: AC | PRN
  Administered 2016-12-30: 20 mL via INTRAVENOUS

## 2017-01-06 DIAGNOSIS — D8689 Sarcoidosis of other sites: Secondary | ICD-10-CM | POA: Diagnosis not present

## 2017-01-06 DIAGNOSIS — D860 Sarcoidosis of lung: Secondary | ICD-10-CM | POA: Diagnosis not present

## 2017-01-06 DIAGNOSIS — R0602 Shortness of breath: Secondary | ICD-10-CM | POA: Diagnosis not present

## 2017-01-06 DIAGNOSIS — G4733 Obstructive sleep apnea (adult) (pediatric): Secondary | ICD-10-CM | POA: Diagnosis not present

## 2017-01-16 DIAGNOSIS — G4733 Obstructive sleep apnea (adult) (pediatric): Secondary | ICD-10-CM | POA: Diagnosis not present

## 2017-02-24 DIAGNOSIS — M461 Sacroiliitis, not elsewhere classified: Secondary | ICD-10-CM | POA: Diagnosis not present

## 2017-02-24 DIAGNOSIS — M9904 Segmental and somatic dysfunction of sacral region: Secondary | ICD-10-CM | POA: Diagnosis not present

## 2017-02-24 DIAGNOSIS — M9903 Segmental and somatic dysfunction of lumbar region: Secondary | ICD-10-CM | POA: Diagnosis not present

## 2017-02-24 DIAGNOSIS — M546 Pain in thoracic spine: Secondary | ICD-10-CM | POA: Diagnosis not present

## 2017-02-24 DIAGNOSIS — M545 Low back pain: Secondary | ICD-10-CM | POA: Diagnosis not present

## 2017-02-24 DIAGNOSIS — M9902 Segmental and somatic dysfunction of thoracic region: Secondary | ICD-10-CM | POA: Diagnosis not present

## 2017-02-27 DIAGNOSIS — M461 Sacroiliitis, not elsewhere classified: Secondary | ICD-10-CM | POA: Diagnosis not present

## 2017-02-27 DIAGNOSIS — M545 Low back pain: Secondary | ICD-10-CM | POA: Diagnosis not present

## 2017-02-27 DIAGNOSIS — M9902 Segmental and somatic dysfunction of thoracic region: Secondary | ICD-10-CM | POA: Diagnosis not present

## 2017-02-27 DIAGNOSIS — M546 Pain in thoracic spine: Secondary | ICD-10-CM | POA: Diagnosis not present

## 2017-02-27 DIAGNOSIS — M9904 Segmental and somatic dysfunction of sacral region: Secondary | ICD-10-CM | POA: Diagnosis not present

## 2017-02-27 DIAGNOSIS — M9903 Segmental and somatic dysfunction of lumbar region: Secondary | ICD-10-CM | POA: Diagnosis not present

## 2017-03-01 DIAGNOSIS — M461 Sacroiliitis, not elsewhere classified: Secondary | ICD-10-CM | POA: Diagnosis not present

## 2017-03-01 DIAGNOSIS — M9904 Segmental and somatic dysfunction of sacral region: Secondary | ICD-10-CM | POA: Diagnosis not present

## 2017-03-01 DIAGNOSIS — M9903 Segmental and somatic dysfunction of lumbar region: Secondary | ICD-10-CM | POA: Diagnosis not present

## 2017-03-01 DIAGNOSIS — M545 Low back pain: Secondary | ICD-10-CM | POA: Diagnosis not present

## 2017-03-01 DIAGNOSIS — M546 Pain in thoracic spine: Secondary | ICD-10-CM | POA: Diagnosis not present

## 2017-03-01 DIAGNOSIS — M9902 Segmental and somatic dysfunction of thoracic region: Secondary | ICD-10-CM | POA: Diagnosis not present

## 2017-03-10 DIAGNOSIS — D869 Sarcoidosis, unspecified: Secondary | ICD-10-CM | POA: Diagnosis not present

## 2017-03-16 DIAGNOSIS — D224 Melanocytic nevi of scalp and neck: Secondary | ICD-10-CM | POA: Diagnosis not present

## 2017-03-17 DIAGNOSIS — M545 Low back pain: Secondary | ICD-10-CM | POA: Diagnosis not present

## 2017-03-17 DIAGNOSIS — M9902 Segmental and somatic dysfunction of thoracic region: Secondary | ICD-10-CM | POA: Diagnosis not present

## 2017-03-17 DIAGNOSIS — M461 Sacroiliitis, not elsewhere classified: Secondary | ICD-10-CM | POA: Diagnosis not present

## 2017-03-17 DIAGNOSIS — M546 Pain in thoracic spine: Secondary | ICD-10-CM | POA: Diagnosis not present

## 2017-03-17 DIAGNOSIS — M9903 Segmental and somatic dysfunction of lumbar region: Secondary | ICD-10-CM | POA: Diagnosis not present

## 2017-03-17 DIAGNOSIS — M9904 Segmental and somatic dysfunction of sacral region: Secondary | ICD-10-CM | POA: Diagnosis not present

## 2017-05-04 DIAGNOSIS — L719 Rosacea, unspecified: Secondary | ICD-10-CM | POA: Diagnosis not present

## 2017-05-04 DIAGNOSIS — Z6841 Body Mass Index (BMI) 40.0 and over, adult: Secondary | ICD-10-CM | POA: Diagnosis not present

## 2017-05-04 DIAGNOSIS — E785 Hyperlipidemia, unspecified: Secondary | ICD-10-CM | POA: Diagnosis not present

## 2017-05-04 DIAGNOSIS — I10 Essential (primary) hypertension: Secondary | ICD-10-CM | POA: Diagnosis not present

## 2017-05-08 DIAGNOSIS — M9904 Segmental and somatic dysfunction of sacral region: Secondary | ICD-10-CM | POA: Diagnosis not present

## 2017-05-08 DIAGNOSIS — M546 Pain in thoracic spine: Secondary | ICD-10-CM | POA: Diagnosis not present

## 2017-05-08 DIAGNOSIS — M9902 Segmental and somatic dysfunction of thoracic region: Secondary | ICD-10-CM | POA: Diagnosis not present

## 2017-05-08 DIAGNOSIS — M9903 Segmental and somatic dysfunction of lumbar region: Secondary | ICD-10-CM | POA: Diagnosis not present

## 2017-05-08 DIAGNOSIS — M461 Sacroiliitis, not elsewhere classified: Secondary | ICD-10-CM | POA: Diagnosis not present

## 2017-05-08 DIAGNOSIS — M545 Low back pain: Secondary | ICD-10-CM | POA: Diagnosis not present

## 2017-05-16 DIAGNOSIS — F331 Major depressive disorder, recurrent, moderate: Secondary | ICD-10-CM | POA: Diagnosis not present

## 2017-05-16 DIAGNOSIS — Z79899 Other long term (current) drug therapy: Secondary | ICD-10-CM | POA: Diagnosis not present

## 2017-05-16 DIAGNOSIS — F411 Generalized anxiety disorder: Secondary | ICD-10-CM | POA: Diagnosis not present

## 2017-05-25 DIAGNOSIS — M546 Pain in thoracic spine: Secondary | ICD-10-CM | POA: Diagnosis not present

## 2017-05-25 DIAGNOSIS — M545 Low back pain: Secondary | ICD-10-CM | POA: Diagnosis not present

## 2017-05-25 DIAGNOSIS — M461 Sacroiliitis, not elsewhere classified: Secondary | ICD-10-CM | POA: Diagnosis not present

## 2017-05-25 DIAGNOSIS — M9904 Segmental and somatic dysfunction of sacral region: Secondary | ICD-10-CM | POA: Diagnosis not present

## 2017-05-25 DIAGNOSIS — M9902 Segmental and somatic dysfunction of thoracic region: Secondary | ICD-10-CM | POA: Diagnosis not present

## 2017-05-25 DIAGNOSIS — M9903 Segmental and somatic dysfunction of lumbar region: Secondary | ICD-10-CM | POA: Diagnosis not present

## 2017-06-15 DIAGNOSIS — M9902 Segmental and somatic dysfunction of thoracic region: Secondary | ICD-10-CM | POA: Diagnosis not present

## 2017-06-15 DIAGNOSIS — M9904 Segmental and somatic dysfunction of sacral region: Secondary | ICD-10-CM | POA: Diagnosis not present

## 2017-06-15 DIAGNOSIS — G4733 Obstructive sleep apnea (adult) (pediatric): Secondary | ICD-10-CM | POA: Diagnosis not present

## 2017-06-15 DIAGNOSIS — D8689 Sarcoidosis of other sites: Secondary | ICD-10-CM | POA: Diagnosis not present

## 2017-06-15 DIAGNOSIS — R0789 Other chest pain: Secondary | ICD-10-CM | POA: Diagnosis not present

## 2017-06-15 DIAGNOSIS — D860 Sarcoidosis of lung: Secondary | ICD-10-CM | POA: Diagnosis not present

## 2017-06-15 DIAGNOSIS — M545 Low back pain: Secondary | ICD-10-CM | POA: Diagnosis not present

## 2017-06-15 DIAGNOSIS — M9903 Segmental and somatic dysfunction of lumbar region: Secondary | ICD-10-CM | POA: Diagnosis not present

## 2017-06-15 DIAGNOSIS — M546 Pain in thoracic spine: Secondary | ICD-10-CM | POA: Diagnosis not present

## 2017-06-15 DIAGNOSIS — R0602 Shortness of breath: Secondary | ICD-10-CM | POA: Diagnosis not present

## 2017-06-15 DIAGNOSIS — M461 Sacroiliitis, not elsewhere classified: Secondary | ICD-10-CM | POA: Diagnosis not present

## 2017-07-06 DIAGNOSIS — M461 Sacroiliitis, not elsewhere classified: Secondary | ICD-10-CM | POA: Diagnosis not present

## 2017-07-06 DIAGNOSIS — M9904 Segmental and somatic dysfunction of sacral region: Secondary | ICD-10-CM | POA: Diagnosis not present

## 2017-07-06 DIAGNOSIS — M546 Pain in thoracic spine: Secondary | ICD-10-CM | POA: Diagnosis not present

## 2017-07-06 DIAGNOSIS — M9903 Segmental and somatic dysfunction of lumbar region: Secondary | ICD-10-CM | POA: Diagnosis not present

## 2017-07-06 DIAGNOSIS — M9902 Segmental and somatic dysfunction of thoracic region: Secondary | ICD-10-CM | POA: Diagnosis not present

## 2017-07-06 DIAGNOSIS — M545 Low back pain: Secondary | ICD-10-CM | POA: Diagnosis not present

## 2017-07-13 DIAGNOSIS — M9902 Segmental and somatic dysfunction of thoracic region: Secondary | ICD-10-CM | POA: Diagnosis not present

## 2017-07-13 DIAGNOSIS — M546 Pain in thoracic spine: Secondary | ICD-10-CM | POA: Diagnosis not present

## 2017-07-13 DIAGNOSIS — M9904 Segmental and somatic dysfunction of sacral region: Secondary | ICD-10-CM | POA: Diagnosis not present

## 2017-07-13 DIAGNOSIS — M461 Sacroiliitis, not elsewhere classified: Secondary | ICD-10-CM | POA: Diagnosis not present

## 2017-07-13 DIAGNOSIS — M545 Low back pain: Secondary | ICD-10-CM | POA: Diagnosis not present

## 2017-07-13 DIAGNOSIS — M9903 Segmental and somatic dysfunction of lumbar region: Secondary | ICD-10-CM | POA: Diagnosis not present

## 2017-07-31 ENCOUNTER — Encounter: Payer: Self-pay | Admitting: Urology

## 2017-07-31 ENCOUNTER — Ambulatory Visit: Payer: PPO | Admitting: Urology

## 2017-07-31 VITALS — BP 141/72 | HR 78 | Ht 75.0 in | Wt 327.6 lb

## 2017-07-31 DIAGNOSIS — N5201 Erectile dysfunction due to arterial insufficiency: Secondary | ICD-10-CM

## 2017-07-31 DIAGNOSIS — F524 Premature ejaculation: Secondary | ICD-10-CM

## 2017-07-31 MED ORDER — SILDENAFIL CITRATE 20 MG PO TABS
20.0000 mg | ORAL_TABLET | Freq: Every day | ORAL | 5 refills | Status: AC | PRN
Start: 2017-07-31 — End: ?

## 2017-07-31 NOTE — Progress Notes (Signed)
07/31/2017 9:54 AM   Darren Taylor 10-15-1966 [id number]  Referring provider: [place] Pink, MD [address] [facility] [place], [place] [address]  Chief Complaint  Patient presents with  . Medication Refill    HPI: F/u -   1) PME- several yr history. He took Lexapro daily for several years and switched to Prozac in 2018. He's had good success. He has multiple medical comorbidities including neurogenic sarcoidosis and depression (a disease like MS per pt).   2) ED - using sildenafil for several years. Increased from 50-80 mg in 2017 which worked OK but not to his complete satisfaction. He is able to achieve an erection with this most of the time that often not maintained. He also is concerned about the limitations of spontaneity with Viagra and must plan to take this medication prior to a sexual encounter. He does try to take it on an empty stomach at least 1 hour prior sexual activity.  He is anxious for Cialis to come out as a generic so that it is more affordable. He complained in 2017 that his interest in sexual activity is lower at times. He had his testosterone checked in the past and this was presumably normal per his report.  3) phimosis  - he is circumcised with redundant foreskin. In 2017 he was concerned about extra skin on his penis which sometimes rolls forward and causes straying with urination.     Today, he changed to Prozac for the neurosarcoid and depression. He takes sildenafil 80 - 100 mg with decent success. He has no LUTS.    PMH: Past Medical History:  Diagnosis Date  . Chronic constipation   . Depression   . ED (erectile dysfunction)   . Hyperlipemia   . Hypertension   . Hypogonadism in male   . Morbid obesity ([place])   . MS (multiple sclerosis) ([place])   . Neurosarcoidosis   . Sleep apnea     Surgical History: Past Surgical History:  Procedure Laterality Date  . Arm Fracture Repair  1999  . LUNG BIOPSY  2013  . Repair of Meniscus   2015  . TONSILLECTOMY  2007    Home Medications:  Allergies as of 07/31/2017   No Known Allergies     Medication List       Accurate as of 07/31/17  9:54 AM. Always use your most recent med list.          ALPRAZolam 0.5 MG tablet Commonly known as:  XANAX   buPROPion 300 MG 24 hr tablet Commonly known as:  WELLBUTRIN XL   escitalopram 10 MG tablet Commonly known as:  LEXAPRO Take 1 tablet (10 mg total) by mouth daily.   FLUoxetine 20 MG tablet Commonly known as:  PROZAC Take 20 mg by mouth daily.   gabapentin 300 MG capsule Commonly known as:  NEURONTIN   HYDROcodone-acetaminophen 5-325 MG tablet Commonly known as:  NORCO/VICODIN Take by mouth.   hydroxychloroquine 200 MG tablet Commonly known as:  PLAQUENIL   levETIRAcetam 500 MG tablet Commonly known as:  KEPPRA Take 500 mg by mouth 2 (two) times daily.   lisinopril-hydrochlorothiazide 20-25 MG tablet Commonly known as:  PRINZIDE,ZESTORETIC TAKE 1 TABLET TWICE DAILY   methylphenidate 20 MG tablet Commonly known as:  RITALIN   PROAIR HFA 108 (90 Base) MCG/ACT inhaler Generic drug:  albuterol Inhale into the lungs.   sildenafil 20 MG tablet Commonly known as:  REVATIO TAKE 2-5 TABLETS (40-100 MG TOTAL) BY  MOUTH AS NEEDED.   simvastatin 20 MG tablet Commonly known as:  ZOCOR   temazepam 15 MG capsule Commonly known as:  RESTORIL Take 15 mg by mouth.   traMADol 50 MG tablet Commonly known as:  ULTRAM   traZODone 100 MG tablet Commonly known as:  DESYREL Take by mouth.       Allergies: No Known Allergies  Family History: Family History  Problem Relation Age of Onset  . Bladder Cancer Neg Hx   . Prostate cancer Neg Hx   . Heart disease Mother     Social History:  reports that he has never smoked. He has never used smokeless tobacco. He reports that he drinks alcohol. He reports that he does not use drugs.  ROS: UROLOGY Frequent Urination?: No Hard to postpone urination?:  No Burning/pain with urination?: No Get up at night to urinate?: No Leakage of urine?: No Urine stream starts and stops?: No Trouble starting stream?: No Do you have to strain to urinate?: No Blood in urine?: No Urinary tract infection?: No Sexually transmitted disease?: No Injury to kidneys or bladder?: No Painful intercourse?: No Weak stream?: No Erection problems?: Yes Penile pain?: No  Gastrointestinal Nausea?: No Vomiting?: No Indigestion/heartburn?: No Diarrhea?: No Constipation?: No  Constitutional Fever: No Night sweats?: Yes Weight loss?: No Fatigue?: Yes  Skin Skin rash/lesions?: No Itching?: No  Eyes Blurred vision?: No Double vision?: No  Ears/Nose/Throat Sore throat?: No Sinus problems?: No  Hematologic/Lymphatic Swollen glands?: No Easy bruising?: No  Cardiovascular Leg swelling?: No Chest pain?: No  Respiratory Cough?: No Shortness of breath?: No  Endocrine Excessive thirst?: Yes  Musculoskeletal Back pain?: Yes Joint pain?: Yes  Neurological Headaches?: Yes Dizziness?: Yes  Psychologic Depression?: Yes Anxiety?: Yes  Physical Exam: BP (!) 141/72 (BP Location: Left Arm, Patient Position: Sitting, Cuff Size: Normal)   Pulse 78   Ht 6\' 3"  (1.905 m)   Wt (!) 148.6 kg (327 lb 9.6 oz)   BMI 40.95 kg/m   Constitutional:  Alert and oriented, No acute distress. HEENT: [place] AT, moist mucus membranes.  Trachea midline, no masses. Cardiovascular: No clubbing, cyanosis, or edema. Respiratory: Normal respiratory effort, no increased work of breathing. GI: Abdomen is soft, nontender, nondistended, no abdominal masses GU: No CVA tenderness.  Penis: obese, buried, normal  Scrotum: normal, testicles palpably normal  DRE: 20 grams, smooth and normal.  Skin: No rashes, bruises or suspicious lesions. Lymph: No cervical or inguinal adenopathy. Neurologic: Grossly intact, no focal deficits, moving all 4 extremities. Psychiatric: Normal  mood and affect.  Laboratory Data: Lab Results  Component Value Date   WBC 9.9 04/17/2012   HGB 14.2 04/17/2012   HCT 43.1 04/17/2012   MCV 91 04/17/2012   PLT 251 04/17/2012    Lab Results  Component Value Date   CREATININE 1.09 04/12/2012    No results found for: PSA  No results found for: TESTOSTERONE  No results found for: HGBA1C  Urinalysis No results found for: COLORURINE, APPEARANCEUR, LABSPEC, PHURINE, GLUCOSEU, HGBUR, BILIRUBINUR, KETONESUR, PROTEINUR, UROBILINOGEN, NITRITE, LEUKOCYTESUR   Assessment & Plan:    1) PME - on prozac for other issues but benefiting as far as PME.  There are no diagnoses linked to this encounter.  2) ED - continue sildenafil. Trial of generic tadalafil when available.   No Follow-up on file.  Festus Aloe, [facility] [address] [place], [place] [address] [phone]

## 2017-08-17 DIAGNOSIS — M9904 Segmental and somatic dysfunction of sacral region: Secondary | ICD-10-CM | POA: Diagnosis not present

## 2017-08-17 DIAGNOSIS — M545 Low back pain: Secondary | ICD-10-CM | POA: Diagnosis not present

## 2017-08-17 DIAGNOSIS — M9902 Segmental and somatic dysfunction of thoracic region: Secondary | ICD-10-CM | POA: Diagnosis not present

## 2017-08-17 DIAGNOSIS — M546 Pain in thoracic spine: Secondary | ICD-10-CM | POA: Diagnosis not present

## 2017-08-17 DIAGNOSIS — M9903 Segmental and somatic dysfunction of lumbar region: Secondary | ICD-10-CM | POA: Diagnosis not present

## 2017-08-23 DIAGNOSIS — Z79899 Other long term (current) drug therapy: Secondary | ICD-10-CM | POA: Diagnosis not present

## 2017-08-23 DIAGNOSIS — F411 Generalized anxiety disorder: Secondary | ICD-10-CM | POA: Diagnosis not present

## 2017-08-23 DIAGNOSIS — F331 Major depressive disorder, recurrent, moderate: Secondary | ICD-10-CM | POA: Diagnosis not present

## 2017-09-07 DIAGNOSIS — M545 Low back pain: Secondary | ICD-10-CM | POA: Diagnosis not present

## 2017-09-07 DIAGNOSIS — M9903 Segmental and somatic dysfunction of lumbar region: Secondary | ICD-10-CM | POA: Diagnosis not present

## 2017-09-07 DIAGNOSIS — M9904 Segmental and somatic dysfunction of sacral region: Secondary | ICD-10-CM | POA: Diagnosis not present

## 2017-09-07 DIAGNOSIS — M9902 Segmental and somatic dysfunction of thoracic region: Secondary | ICD-10-CM | POA: Diagnosis not present

## 2017-09-07 DIAGNOSIS — M546 Pain in thoracic spine: Secondary | ICD-10-CM | POA: Diagnosis not present

## 2017-10-20 DIAGNOSIS — G8929 Other chronic pain: Secondary | ICD-10-CM | POA: Diagnosis not present

## 2017-10-20 DIAGNOSIS — M1712 Unilateral primary osteoarthritis, left knee: Secondary | ICD-10-CM | POA: Diagnosis not present

## 2017-10-20 DIAGNOSIS — M25562 Pain in left knee: Secondary | ICD-10-CM | POA: Diagnosis not present

## 2017-11-03 DIAGNOSIS — M25662 Stiffness of left knee, not elsewhere classified: Secondary | ICD-10-CM | POA: Diagnosis not present

## 2017-11-03 DIAGNOSIS — M1712 Unilateral primary osteoarthritis, left knee: Secondary | ICD-10-CM | POA: Diagnosis not present

## 2017-11-03 DIAGNOSIS — R29898 Other symptoms and signs involving the musculoskeletal system: Secondary | ICD-10-CM | POA: Diagnosis not present

## 2017-11-03 DIAGNOSIS — M25562 Pain in left knee: Secondary | ICD-10-CM | POA: Diagnosis not present

## 2017-11-07 DIAGNOSIS — M1712 Unilateral primary osteoarthritis, left knee: Secondary | ICD-10-CM | POA: Diagnosis not present

## 2017-11-09 DIAGNOSIS — M1712 Unilateral primary osteoarthritis, left knee: Secondary | ICD-10-CM | POA: Diagnosis not present

## 2017-11-16 DIAGNOSIS — M1712 Unilateral primary osteoarthritis, left knee: Secondary | ICD-10-CM | POA: Diagnosis not present

## 2017-11-21 DIAGNOSIS — D869 Sarcoidosis, unspecified: Secondary | ICD-10-CM | POA: Diagnosis not present

## 2017-11-21 DIAGNOSIS — G4733 Obstructive sleep apnea (adult) (pediatric): Secondary | ICD-10-CM | POA: Diagnosis not present

## 2017-11-21 DIAGNOSIS — D8689 Sarcoidosis of other sites: Secondary | ICD-10-CM | POA: Diagnosis not present

## 2017-11-21 DIAGNOSIS — R0609 Other forms of dyspnea: Secondary | ICD-10-CM | POA: Diagnosis not present

## 2017-11-21 DIAGNOSIS — M1712 Unilateral primary osteoarthritis, left knee: Secondary | ICD-10-CM | POA: Diagnosis not present

## 2017-11-30 DIAGNOSIS — M1712 Unilateral primary osteoarthritis, left knee: Secondary | ICD-10-CM | POA: Diagnosis not present

## 2017-12-11 ENCOUNTER — Other Ambulatory Visit: Payer: Self-pay | Admitting: Orthopedic Surgery

## 2017-12-11 DIAGNOSIS — M1712 Unilateral primary osteoarthritis, left knee: Secondary | ICD-10-CM

## 2017-12-14 ENCOUNTER — Ambulatory Visit
Admission: RE | Admit: 2017-12-14 | Discharge: 2017-12-14 | Disposition: A | Discharge status: Home / Self Care | Payer: Medicare HMO | Source: Ambulatory Visit | Attending: Orthopedic Surgery | Admitting: Orthopedic Surgery

## 2017-12-14 DIAGNOSIS — X58XXXA Exposure to other specified factors, initial encounter: Secondary | ICD-10-CM | POA: Insufficient documentation

## 2017-12-14 DIAGNOSIS — S83207A Unspecified tear of unspecified meniscus, current injury, left knee, initial encounter: Secondary | ICD-10-CM | POA: Insufficient documentation

## 2017-12-14 DIAGNOSIS — M1712 Unilateral primary osteoarthritis, left knee: Secondary | ICD-10-CM | POA: Diagnosis not present

## 2017-12-14 DIAGNOSIS — M25461 Effusion, right knee: Secondary | ICD-10-CM | POA: Insufficient documentation

## 2017-12-14 DIAGNOSIS — M719 Bursopathy, unspecified: Secondary | ICD-10-CM | POA: Diagnosis not present

## 2017-12-26 ENCOUNTER — Encounter
Admission: RE | Admit: 2017-12-26 | Discharge: 2017-12-26 | Disposition: A | Discharge status: Home / Self Care | Payer: Medicare HMO | Source: Ambulatory Visit | Attending: Orthopedic Surgery | Admitting: Orthopedic Surgery

## 2017-12-26 ENCOUNTER — Other Ambulatory Visit: Payer: Self-pay

## 2017-12-26 DIAGNOSIS — Z01812 Encounter for preprocedural laboratory examination: Secondary | ICD-10-CM | POA: Insufficient documentation

## 2017-12-26 DIAGNOSIS — Z01810 Encounter for preprocedural cardiovascular examination: Secondary | ICD-10-CM | POA: Diagnosis not present

## 2017-12-26 DIAGNOSIS — I10 Essential (primary) hypertension: Secondary | ICD-10-CM

## 2017-12-26 DIAGNOSIS — G4730 Sleep apnea, unspecified: Secondary | ICD-10-CM

## 2017-12-26 HISTORY — DX: Tremor, unspecified: R25.1

## 2017-12-26 HISTORY — DX: Other specified protozoal intestinal diseases: A07.8

## 2017-12-26 HISTORY — DX: Disorder of central nervous system, unspecified: G96.9

## 2017-12-26 HISTORY — DX: Unspecified osteoarthritis, unspecified site: M19.90

## 2017-12-26 LAB — CBC
HCT: 45.6 % (ref 40.0–52.0)
HEMOGLOBIN: 15.6 g/dL (ref 13.0–18.0)
MCH: 30.8 pg (ref 26.0–34.0)
MCHC: 34.2 g/dL (ref 32.0–36.0)
MCV: 90 fL (ref 80.0–100.0)
Platelets: 294 10*3/uL (ref 150–440)
RBC: 5.07 MIL/uL (ref 4.40–5.90)
RDW: 12.9 % (ref 11.5–14.5)
WBC: 6 10*3/uL (ref 3.8–10.6)

## 2017-12-26 LAB — BASIC METABOLIC PANEL
Anion gap: 11 (ref 5–15)
BUN: 24 mg/dL — AB (ref 6–20)
CHLORIDE: 102 mmol/L (ref 101–111)
CO2: 23 mmol/L (ref 22–32)
CREATININE: 0.95 mg/dL (ref 0.61–1.24)
Calcium: 9.8 mg/dL (ref 8.9–10.3)
GFR calc Af Amer: >60 mL/min (ref >60)
GFR calc non Af Amer: >60 mL/min (ref >60)
Glucose, Bld: 99 mg/dL (ref 65–99)
Potassium: 3.9 mmol/L (ref 3.5–5.1)
SODIUM: 136 mmol/L (ref 135–145)

## 2017-12-26 NOTE — Patient Instructions (Addendum)
Your procedure is scheduled on: 01/04/18 Thurs Report to Same Day Surgery 2nd floor medical mall [facility] Entrance-take elevator on left to 2nd floor.  Check in with surgery information desk.) To find out your arrival time please call [phone] between 1PM - 3PM on 01/03/18 Wed  Remember: Instructions that are not followed completely may result in serious medical risk, up to and including death, or upon the discretion of your surgeon and anesthesiologist your surgery may need to be rescheduled.    _x___ 1. Do not eat food after midnight the night before your procedure. You may drink clear liquids up to 2 hours before you are scheduled to arrive at the hospital for your procedure.  Do not drink clear liquids within 2 hours of your scheduled arrival to the hospital.  Clear liquids include  --Water or Apple juice without pulp  --Clear carbohydrate beverage such as ClearFast or Gatorade  --Black Coffee or Clear Tea (No milk, no creamers, do not add anything to                  the coffee or Tea Type 1 and type 2 diabetics should only drink water.  No gum chewing or hard candies.     __x__ 2. No Alcohol for 24 hours before or after surgery.   __x__3. No Smoking for 24 prior to surgery.   ____  4. Bring all medications with you on the day of surgery if instructed.    __x__ 5. Notify your doctor if there is any change in your medical condition     (cold, fever, infections).     Do not wear jewelry, make-up, hairpins, clips or nail polish.  Do not wear lotions, powders, or perfumes. You may wear deodorant.  Do not shave 48 hours prior to surgery. Men may shave face and neck.  Do not bring valuables to the hospital.    [facility] is not responsible for any belongings or valuables.               Contacts, dentures or bridgework may not be worn into surgery.  Leave your suitcase in the car. After surgery it may be brought to your room.  For patients admitted to the hospital, discharge  time is determined by your                       treatment team.   Patients discharged the day of surgery will not be allowed to drive home.  You will need someone to drive you home and stay with you the night of your procedure.    Please read over the following fact sheets that you were given:   [facility] Preparing for Surgery and or MRSA Information   _x___ Take anti-hypertensive listed below, cardiac, seizure, asthma,     anti-reflux and psychiatric medicines. These include:  1. albuterol (PROAIR HFA) 108 (90 BASE) MCG/ACT inhaler if needed  2.ALPRAZolam (XANAX) 0.5 MG tablet if needed  3.buPROPion (WELLBUTRIN XL) 300 MG 24 hr tablet  4.FLUoxetine (PROZAC) 40 MG capsule  5.gabapentin (NEURONTIN) 300 MG capsule  6.HYDROcodone-acetaminophen (NORCO/VICODIN) 5-325 MG tablet if needed  7.hydroxychloroquine (PLAQUENIL) 200 MG tablet  ____Fleets enema or Magnesium Citrate as directed.   _x___ Use CHG Soap or sage wipes as directed on instruction sheet   ____ Use inhalers on the day of surgery and bring to hospital day of surgery  ____ Stop Metformin and Janumet 2 days prior to surgery.  ____ Take 1/2 of usual insulin dose the night before surgery and none on the morning     surgery.   _x___ Follow recommendations from Cardiologist, Pulmonologist or PCP regarding          stopping Aspirin, Coumadin, Plavix ,Eliquis, Effient, or Pradaxa, and Pletal.  X____Stop Anti-inflammatories such as Advil, Aleve, Ibuprofen, Motrin, Naproxen, Naprosyn, Goodies powders or aspirin products. OK to take Tylenol and  Celebrex.   _x___ Stop supplements until after surgery.  But may continue Vitamin D, Vitamin B,       and multivitamin. Stop turmeric and vitamin c until after surgery.   __x__ Bring C-Pap to the hospital.

## 2017-12-26 NOTE — Pre-Procedure Instructions (Signed)
Called Dr Marcello Moores regarding patient's medical history.  "Sounds like enough has been done."

## 2018-01-03 MED ORDER — DEXTROSE 5 % IV SOLN
3.0000 g | Freq: Once | INTRAVENOUS | Status: AC
  Administered 2018-01-04: 3 g via INTRAVENOUS
  Filled 2018-01-03: qty 3000

## 2018-01-04 ENCOUNTER — Ambulatory Visit: Payer: Medicare HMO | Admitting: Anesthesiology

## 2018-01-04 ENCOUNTER — Encounter
Admission: RE | Disposition: A | Discharge status: Home / Self Care | Payer: Self-pay | Source: Ambulatory Visit | Attending: Orthopedic Surgery

## 2018-01-04 ENCOUNTER — Other Ambulatory Visit: Payer: Self-pay

## 2018-01-04 ENCOUNTER — Ambulatory Visit
Admission: RE | Admit: 2018-01-04 | Discharge: 2018-01-04 | Disposition: A | Discharge status: Home / Self Care | Payer: Medicare HMO | Source: Ambulatory Visit | Attending: Orthopedic Surgery | Admitting: Orthopedic Surgery

## 2018-01-04 DIAGNOSIS — M94262 Chondromalacia, left knee: Secondary | ICD-10-CM | POA: Diagnosis not present

## 2018-01-04 DIAGNOSIS — Z79899 Other long term (current) drug therapy: Secondary | ICD-10-CM | POA: Diagnosis not present

## 2018-01-04 DIAGNOSIS — E785 Hyperlipidemia, unspecified: Secondary | ICD-10-CM | POA: Insufficient documentation

## 2018-01-04 DIAGNOSIS — G35 Multiple sclerosis: Secondary | ICD-10-CM | POA: Diagnosis not present

## 2018-01-04 DIAGNOSIS — M6752 Plica syndrome, left knee: Secondary | ICD-10-CM | POA: Diagnosis not present

## 2018-01-04 DIAGNOSIS — G4730 Sleep apnea, unspecified: Secondary | ICD-10-CM | POA: Diagnosis not present

## 2018-01-04 DIAGNOSIS — F329 Major depressive disorder, single episode, unspecified: Secondary | ICD-10-CM | POA: Insufficient documentation

## 2018-01-04 DIAGNOSIS — Z6841 Body Mass Index (BMI) 40.0 and over, adult: Secondary | ICD-10-CM | POA: Diagnosis not present

## 2018-01-04 DIAGNOSIS — Z9989 Dependence on other enabling machines and devices: Secondary | ICD-10-CM | POA: Diagnosis not present

## 2018-01-04 DIAGNOSIS — Z8249 Family history of ischemic heart disease and other diseases of the circulatory system: Secondary | ICD-10-CM | POA: Diagnosis not present

## 2018-01-04 DIAGNOSIS — M65862 Other synovitis and tenosynovitis, left lower leg: Secondary | ICD-10-CM | POA: Insufficient documentation

## 2018-01-04 DIAGNOSIS — I10 Essential (primary) hypertension: Secondary | ICD-10-CM | POA: Insufficient documentation

## 2018-01-04 DIAGNOSIS — M23222 Derangement of posterior horn of medial meniscus due to old tear or injury, left knee: Secondary | ICD-10-CM | POA: Insufficient documentation

## 2018-01-04 DIAGNOSIS — M1712 Unilateral primary osteoarthritis, left knee: Secondary | ICD-10-CM | POA: Diagnosis present

## 2018-01-04 HISTORY — PX: KNEE ARTHROSCOPY WITH MENISCAL REPAIR: SHX5653

## 2018-01-04 SURGERY — ARTHROSCOPY, KNEE, WITH MENISCUS REPAIR
Anesthesia: General | Laterality: Left

## 2018-01-04 MED ORDER — EPHEDRINE SULFATE 50 MG/ML IJ SOLN
INTRAMUSCULAR | Status: DC | PRN
Start: 2018-01-04 — End: 2018-01-04
  Administered 2018-01-04 (×3): 10 mg via INTRAVENOUS

## 2018-01-04 MED ORDER — FAMOTIDINE 20 MG PO TABS
20.0000 mg | ORAL_TABLET | Freq: Once | ORAL | Status: AC
  Administered 2018-01-04: 20 mg via ORAL

## 2018-01-04 MED ORDER — GLYCOPYRROLATE 0.2 MG/ML IJ SOLN
INTRAMUSCULAR | Status: DC | PRN
  Administered 2018-01-04: 0.2 mg via INTRAVENOUS

## 2018-01-04 MED ORDER — LACTATED RINGERS IV SOLN
INTRAVENOUS | Status: DC
  Administered 2018-01-04 (×2): via INTRAVENOUS

## 2018-01-04 MED ORDER — FENTANYL CITRATE (PF) 100 MCG/2ML IJ SOLN
INTRAMUSCULAR | Status: DC | PRN
  Administered 2018-01-04: 50 ug via INTRAVENOUS

## 2018-01-04 MED ORDER — FENTANYL CITRATE (PF) 100 MCG/2ML IJ SOLN
INTRAMUSCULAR | Status: AC
  Administered 2018-01-04: 100 ug via INTRAVENOUS
  Filled 2018-01-04: qty 2

## 2018-01-04 MED ORDER — SODIUM CHLORIDE 0.9 % IV SOLN: INTRAVENOUS | Status: DC

## 2018-01-04 MED ORDER — HYDROCODONE-ACETAMINOPHEN 5-325 MG PO TABS
1.0000 | ORAL_TABLET | ORAL | Status: DC | PRN
  Administered 2018-01-04: 1 via ORAL

## 2018-01-04 MED ORDER — LIDOCAINE HCL (PF) 2 % IJ SOLN
INTRAMUSCULAR | Status: AC
  Filled 2018-01-04: qty 10

## 2018-01-04 MED ORDER — PROPOFOL 10 MG/ML IV BOLUS
INTRAVENOUS | Status: DC | PRN
  Administered 2018-01-04: 150 mg via INTRAVENOUS

## 2018-01-04 MED ORDER — METOCLOPRAMIDE HCL 5 MG/ML IJ SOLN: 5.0000 mg | Freq: Three times a day (TID) | INTRAMUSCULAR | Status: DC | PRN

## 2018-01-04 MED ORDER — FAMOTIDINE 20 MG PO TABS
ORAL_TABLET | ORAL | Status: AC
  Administered 2018-01-04: 20 mg via ORAL
  Filled 2018-01-04: qty 1

## 2018-01-04 MED ORDER — ONDANSETRON HCL 4 MG/2ML IJ SOLN
INTRAMUSCULAR | Status: DC | PRN
  Administered 2018-01-04: 4 mg via INTRAVENOUS

## 2018-01-04 MED ORDER — FENTANYL CITRATE (PF) 100 MCG/2ML IJ SOLN
INTRAMUSCULAR | Status: AC
  Administered 2018-01-04: 25 ug via INTRAVENOUS
  Filled 2018-01-04: qty 2

## 2018-01-04 MED ORDER — MIDAZOLAM HCL 2 MG/2ML IJ SOLN
INTRAMUSCULAR | Status: AC
  Filled 2018-01-04: qty 2

## 2018-01-04 MED ORDER — HYDROCODONE-ACETAMINOPHEN 5-325 MG PO TABS
ORAL_TABLET | ORAL | Status: AC
  Filled 2018-01-04: qty 1

## 2018-01-04 MED ORDER — ONDANSETRON HCL 4 MG/2ML IJ SOLN: 4.0000 mg | Freq: Four times a day (QID) | INTRAMUSCULAR | Status: DC | PRN

## 2018-01-04 MED ORDER — FENTANYL CITRATE (PF) 100 MCG/2ML IJ SOLN
25.0000 ug | INTRAMUSCULAR | Status: AC | PRN
  Administered 2018-01-04 (×4): 25 ug via INTRAVENOUS
  Administered 2018-01-04: 100 ug via INTRAVENOUS
  Administered 2018-01-04: 25 ug via INTRAVENOUS

## 2018-01-04 MED ORDER — HYDROCODONE-ACETAMINOPHEN 5-325 MG PO TABS: 1.0000 | ORAL_TABLET | Freq: Four times a day (QID) | ORAL | 0 refills | Status: AC | PRN

## 2018-01-04 MED ORDER — EPHEDRINE SULFATE 50 MG/ML IJ SOLN
INTRAMUSCULAR | Status: AC
  Filled 2018-01-04: qty 1

## 2018-01-04 MED ORDER — MIDAZOLAM HCL 2 MG/2ML IJ SOLN
INTRAMUSCULAR | Status: DC | PRN
  Administered 2018-01-04: 2 mg via INTRAVENOUS

## 2018-01-04 MED ORDER — BUPIVACAINE-EPINEPHRINE (PF) 0.5% -1:200000 IJ SOLN
INTRAMUSCULAR | Status: DC | PRN
  Administered 2018-01-04: 30 mL via PERINEURAL

## 2018-01-04 MED ORDER — ONDANSETRON HCL 4 MG/2ML IJ SOLN: 4.0000 mg | Freq: Once | INTRAMUSCULAR | Status: DC | PRN

## 2018-01-04 MED ORDER — FENTANYL CITRATE (PF) 100 MCG/2ML IJ SOLN
INTRAMUSCULAR | Status: AC
  Filled 2018-01-04: qty 2

## 2018-01-04 MED ORDER — DEXAMETHASONE SODIUM PHOSPHATE 10 MG/ML IJ SOLN
INTRAMUSCULAR | Status: DC | PRN
  Administered 2018-01-04: 10 mg via INTRAVENOUS

## 2018-01-04 MED ORDER — PROPOFOL 10 MG/ML IV BOLUS
INTRAVENOUS | Status: AC
  Filled 2018-01-04: qty 20

## 2018-01-04 MED ORDER — ONDANSETRON HCL 4 MG PO TABS: 4.0000 mg | ORAL_TABLET | Freq: Four times a day (QID) | ORAL | Status: DC | PRN

## 2018-01-04 MED ORDER — LIDOCAINE HCL (CARDIAC) 20 MG/ML IV SOLN
INTRAVENOUS | Status: DC | PRN
Start: 2018-01-04 — End: 2018-01-04
  Administered 2018-01-04: 100 mg via INTRAVENOUS

## 2018-01-04 MED ORDER — ONDANSETRON HCL 4 MG/2ML IJ SOLN
INTRAMUSCULAR | Status: AC
  Filled 2018-01-04: qty 2

## 2018-01-04 MED ORDER — METOCLOPRAMIDE HCL 10 MG PO TABS: 5.0000 mg | ORAL_TABLET | Freq: Three times a day (TID) | ORAL | Status: DC | PRN

## 2018-01-04 MED ORDER — GLYCOPYRROLATE 0.2 MG/ML IJ SOLN
INTRAMUSCULAR | Status: AC
  Filled 2018-01-04: qty 1

## 2018-01-04 MED ORDER — DEXAMETHASONE SODIUM PHOSPHATE 10 MG/ML IJ SOLN
INTRAMUSCULAR | Status: AC
  Filled 2018-01-04: qty 1

## 2018-01-04 SURGICAL SUPPLY — 26 items
BANDAGE ACE 4X5 VEL STRL LF (GAUZE/BANDAGES/DRESSINGS) ×2 IMPLANT
BLADE INCISOR PLUS 4.5 (BLADE) IMPLANT
CHLORAPREP W/TINT 26ML (MISCELLANEOUS) ×3 IMPLANT
CUFF TOURN 24 STER (MISCELLANEOUS) IMPLANT
CUFF TOURN 30 STER DUAL PORT (MISCELLANEOUS) IMPLANT
CUFF TOURN SGL QUICK 34 (TOURNIQUET CUFF) ×3
CUFF TRNQT CYL 34X4X40X1 (TOURNIQUET CUFF) IMPLANT
GAUZE SPONGE 4X4 12PLY STRL (GAUZE/BANDAGES/DRESSINGS) ×3 IMPLANT
GLOVE SURG SYN 9.0  PF PI (GLOVE) ×2
GLOVE SURG SYN 9.0 PF PI (GLOVE) ×1 IMPLANT
GOWN SRG 2XL LVL 4 RGLN SLV (GOWNS) ×1 IMPLANT
GOWN STRL NON-REIN 2XL LVL4 (GOWNS) ×3
GOWN STRL REUS W/ TWL LRG LVL3 (GOWN DISPOSABLE) ×2 IMPLANT
GOWN STRL REUS W/TWL LRG LVL3 (GOWN DISPOSABLE) ×6
IV LACTATED RINGER IRRG 3000ML (IV SOLUTION) ×6
IV LR IRRIG 3000ML ARTHROMATIC (IV SOLUTION) ×2 IMPLANT
KIT RM TURNOVER STRD PROC AR (KITS) ×3 IMPLANT
MANIFOLD NEPTUNE II (INSTRUMENTS) ×3 IMPLANT
PACK ARTHROSCOPY KNEE (MISCELLANEOUS) ×3 IMPLANT
SET TUBE SUCT SHAVER OUTFL 24K (TUBING) ×3 IMPLANT
SET TUBE TIP INTRA-ARTICULAR (MISCELLANEOUS) ×3 IMPLANT
SUT ETHILON 4-0 (SUTURE) ×3
SUT ETHILON 4-0 FS2 18XMFL BLK (SUTURE) ×1
SUTURE ETHLN 4-0 FS2 18XMF BLK (SUTURE) ×1 IMPLANT
TUBING ARTHRO INFLOW-ONLY STRL (TUBING) ×3 IMPLANT
WAND COBLATION FLOW 50 (SURGICAL WAND) ×3 IMPLANT

## 2018-01-04 NOTE — Anesthesia Post-op Follow-up Note (Signed)
Anesthesia QCDR form completed.        

## 2018-01-04 NOTE — H&P (Signed)
Reviewed paper H+P, will be scanned into chart. No changes noted.  

## 2018-01-04 NOTE — Transfer of Care (Signed)
Immediate Anesthesia Transfer of Care Note  Patient: Darren Taylor  Procedure(s) Performed: KNEE ARTHROSCOPY WITH MENISCAL REPAIR (Left )  Patient Location: PACU  Anesthesia Type:General  Level of Consciousness: drowsy and patient cooperative  Airway & Oxygen Therapy: Patient Spontanous Breathing and Patient connected to face mask oxygen  Post-op Assessment: Report given to RN and Post -op Vital signs reviewed and stable  Post vital signs: Reviewed and stable  Last Vitals:  Vitals:   01/04/18 0728  BP: 129/86  Pulse: 71  Resp: 20  Temp: (!) 36.3 C  SpO2: 100%    Last Pain:  Vitals:   01/04/18 0728  TempSrc: Temporal  PainSc: 6          Complications: No apparent anesthesia complications

## 2018-01-04 NOTE — Anesthesia Preprocedure Evaluation (Addendum)
Anesthesia Evaluation  Patient identified by MRN, date of birth, ID band Patient awake    Reviewed: Allergy & Precautions, NPO status , Patient's Chart, lab work & pertinent test results, reviewed documented beta blocker date and time   Airway Mallampati: III  TM Distance: >3 FB     Dental  (+) Chipped, Missing   Pulmonary sleep apnea and Continuous Positive Airway Pressure Ventilation ,           Cardiovascular hypertension, Pt. on medications      Neuro/Psych PSYCHIATRIC DISORDERS Depression    GI/Hepatic   Endo/Other  Morbid obesity  Renal/GU      Musculoskeletal  (+) Arthritis ,   Abdominal   Peds  Hematology   Anesthesia Other Findings Pt denies MS. Says he has sarcoid.  Reproductive/Obstetrics                            Anesthesia Physical Anesthesia Plan  ASA: III  Anesthesia Plan: General   Post-op Pain Management:    Induction: Intravenous  PONV Risk Score and Plan:   Airway Management Planned: LMA  Additional Equipment:   Intra-op Plan:   Post-operative Plan:   Informed Consent: I have reviewed the patients History and Physical, chart, labs and discussed the procedure including the risks, benefits and alternatives for the proposed anesthesia with the patient or authorized representative who has indicated his/her understanding and acceptance.     Plan Discussed with: CRNA  Anesthesia Plan Comments:         Anesthesia Quick Evaluation

## 2018-01-04 NOTE — OR Nursing (Signed)
Discharge 9instructions discussed with pt and wife. Both voice understanding.

## 2018-01-04 NOTE — Anesthesia Procedure Notes (Signed)
Procedure Name: LMA Insertion Date/Time: 01/04/2018 8:22 AM Performed by: Gunnar Bulla, MD Pre-anesthesia Checklist: Patient identified, Emergency Drugs available, Suction available, Patient being monitored and Timeout performed Patient Re-evaluated:Patient Re-evaluated prior to induction Oxygen Delivery Method: Circle system utilized Preoxygenation: Pre-oxygenation with 100% oxygen Induction Type: IV induction Ventilation: Mask ventilation without difficulty LMA: LMA inserted LMA Size: 5.0 Number of attempts: 1 Placement Confirmation: positive ETCO2 and breath sounds checked- equal and bilateral Tube secured with: Tape Dental Injury: Teeth and Oropharynx as per pre-operative assessment

## 2018-01-04 NOTE — Discharge Instructions (Addendum)
Keep dressing clean and dry.  Aspirin daily.  Weight bear as tolerated      AMBULATORY SURGERY  DISCHARGE INSTRUCTIONS   1) The drugs that you were given will stay in your system until tomorrow so for the next 24 hours you should not:  A) Drive an automobile B) Make any legal decisions C) Drink any alcoholic beverage   2) You may resume regular meals tomorrow.  Today it is better to start with liquids and gradually work up to solid foods.  You may eat anything you prefer, but it is better to start with liquids, then soup and crackers, and gradually work up to solid foods.   3) Please notify your doctor immediately if you have any unusual bleeding, trouble breathing, redness and pain at the surgery site, drainage, fever, or pain not relieved by medication.    4) Additional Instructions:        Please contact your physician with any problems or Same Day Surgery at [phone], Monday through Friday 6 am to 4 pm, or [place] at [facility] number at [phone].

## 2018-01-04 NOTE — Op Note (Signed)
01/04/2018  9:13 AM  PATIENT:  Darren Taylor  52 y.o. male  PRE-OPERATIVE DIAGNOSIS:  PRIMARY LOCALIZED OSTEOARTHRITIS OF LEFT KNEE  POST-OPERATIVE DIAGNOSIS:  PRIMARY LOCALIZED OSTEOARTHRITIS OF LEFT KNEE  PROCEDURE:  Procedure(s): KNEE ARTHROSCOPY WITH MENISCAL REPAIR (Left)  SURGEON: Laurene Footman, MD  ASSISTANTS: none  ANESTHESIA:   general  EBL:  Total I/O In: 400 [I.V.:400] Out: 5 [Blood:5]  BLOOD ADMINISTERED:none  DRAINS: none   LOCAL MEDICATIONS USED:  MARCAINE     SPECIMEN:  No Specimen  DISPOSITION OF SPECIMEN:  N/A  COUNTS:  YES  TOURNIQUET:  * No tourniquets in log *  8 minutes at 300 mm  Hg  IMPLANTS: none  Patient was brought to the operating room and after adequate anesthesia was obtained the left leg was prepped and draped in sterile manner with her scopic leg holder and tourniquet applied. After prepping and draping in usual sterile manner appropriate patient identification and timeout procedure were completed. Inferolateral portal was made and initial inspection revealed market synovitis in the suprapatellar pouch as well as the thick plica band impinging on the patellofemoral joint. There was exposed bone in the femoral trochlea and extensive degenerative changes to the patella with normal tracking. No loose bodies were noted in the gutters. Going the medial compartment inferior medial portal was made on probing there is a complex tear of the posterior third the width of the tear extending to near the capsule from the junction of the middle and posterior thirds this essentially debrided with meniscal punch and ArthriCare wand. There is moderate degenerative changes to the medial compartment anterior cruciate ligament intact lateral compartment was essentially normal very minimal chondromalacia this point synovectomy was carried out with the ArthroCare wand and shaver the tourniquet was raised because of bleeding from the synovium during this part of the  procedure after the synovectomy in the suprapatellar pouch and plica band were excised argentation was withdrawn wounds were closed with simple interrupted 4-0 nylon 20 cc half percent Sensorcaine was infiltrated into near the portals. Therefore by fours web roll and Ace wrap applied  DICTATION: .Sales executive    PLAN OF CARE: Discharge to home after PACU  PATIENT DISPOSITION:  PACU - hemodynamically stable.

## 2018-01-04 NOTE — OR Nursing (Signed)
Patient requested [facility] services.  Paged and spoke with [facility] on call.

## 2018-01-04 NOTE — Anesthesia Postprocedure Evaluation (Signed)
Anesthesia Post Note  Patient: Darren Taylor  Procedure(s) Performed: KNEE ARTHROSCOPY WITH MENISCAL REPAIR (Left )  Patient location during evaluation: PACU Anesthesia Type: General Level of consciousness: awake and alert Pain management: pain level controlled Vital Signs Assessment: post-procedure vital signs reviewed and stable Respiratory status: spontaneous breathing, nonlabored ventilation, respiratory function stable and patient connected to nasal cannula oxygen Cardiovascular status: blood pressure returned to baseline and stable Postop Assessment: no apparent nausea or vomiting Anesthetic complications: no     Last Vitals:  Vitals:   01/04/18 1041 01/04/18 1047  BP: 136/70 127/69  Pulse:    Resp: 18 18  Temp: 36.6 C   SpO2: 99% 100%    Last Pain:  Vitals:   01/04/18 1122  TempSrc:   PainSc: [place]

## 2018-01-15 ENCOUNTER — Telehealth: Payer: Self-pay | Admitting: Urology

## 2018-01-15 NOTE — Telephone Encounter (Signed)
Patient wants to try and change his ED medication from sildenafil to generic Cialis. I told him about the [facility] that it may be cheaper there? Not sure, if so he would be interested in having it sent there. He was asking if he needed a follow up app first? He was seeing [place] but he is not coming back. Please advise and call the patient back to let him know @ [phone].  Sharyn Lull

## 2018-01-18 ENCOUNTER — Other Ambulatory Visit: Payer: Self-pay | Admitting: Family Medicine

## 2018-01-18 MED ORDER — TADALAFIL 5 MG PO TABS: 5.0000 mg | ORAL_TABLET | Freq: Every day | ORAL | 0 refills | Status: DC | PRN

## 2018-03-22 ENCOUNTER — Telehealth: Payer: Self-pay | Admitting: Family Medicine

## 2018-03-22 MED ORDER — TADALAFIL 20 MG PO TABS: 20.0000 mg | ORAL_TABLET | Freq: Every day | ORAL | 0 refills | Status: DC | PRN

## 2018-03-22 NOTE — Telephone Encounter (Signed)
20 mg Cialis is fine.  Hollice Espy, MD

## 2018-03-22 NOTE — Telephone Encounter (Signed)
Patient notified and wanted RX printed so he could compare prices.

## 2018-03-22 NOTE — Telephone Encounter (Signed)
Patient is calling to request a refill on Cialis. He states that the 5mg , 10mg  does not work for him.  He is requesting 20mg  tablet with a qty of 10.    He would like to pick up the script and take to his pharmacy for price comparison.  Call him and let him know if available at [phone].

## 2018-03-22 NOTE — Telephone Encounter (Signed)
Patient wants an increase in the Cialis, he said he has been doubling up on the medication. He is scheduled for a 1 year follow up in August. Should I push up his appointment? Please advise

## 2018-06-05 ENCOUNTER — Other Ambulatory Visit: Payer: Self-pay | Admitting: Urology

## 2018-08-02 ENCOUNTER — Ambulatory Visit: Payer: PPO

## 2019-05-17 IMAGING — MR MR KNEE*L* W/O CM
6 series · 38 of 40 positions shown · non-contrast
Comparison: 05/02/2014

CLINICAL DATA: Knee pain and swelling with popping. Prior fall.
Prior knee surgery.

EXAM:
MRI OF THE LEFT KNEE WITHOUT CONTRAST
TECHNIQUE: Multiplanar, multisequence MR imaging of the knee was performed. No
intravenous contrast was administered.

[Series 3: PD fat-sat · axial · 3.0mm · 0.33mm/px · z∈[-66,+66]mm · 8 of 41 slices shown (1 of 4)]
[im 1/41]
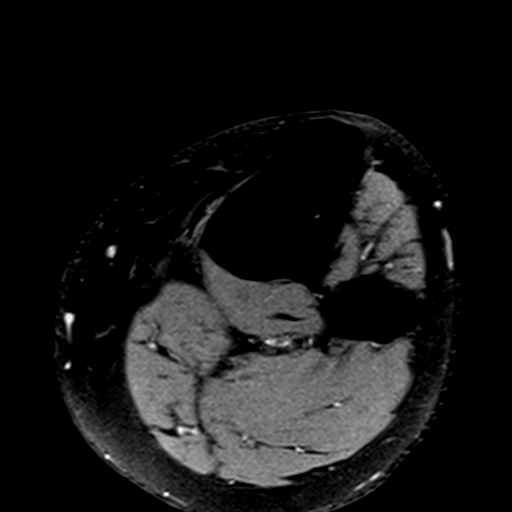
[im 6/41]
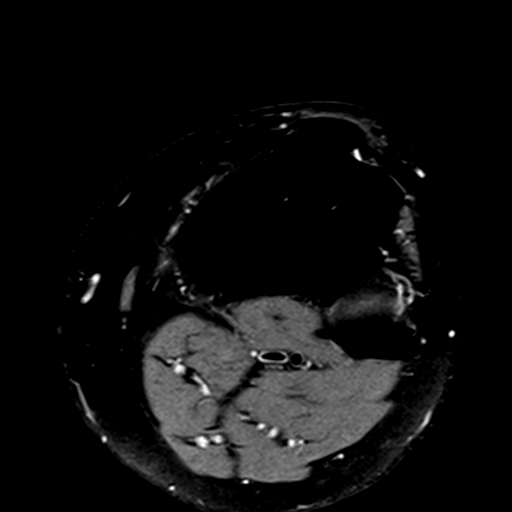
[im 12/41]
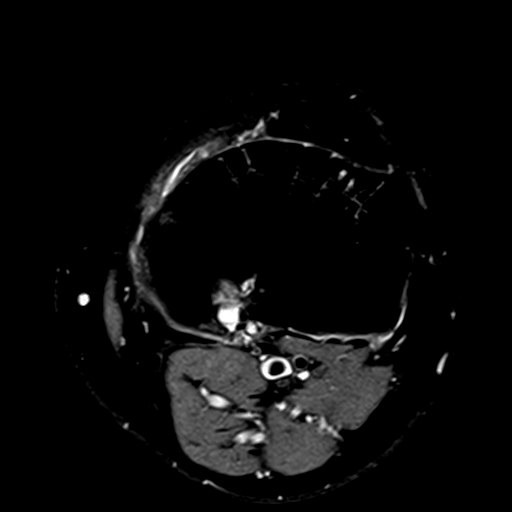
[im 18/41]
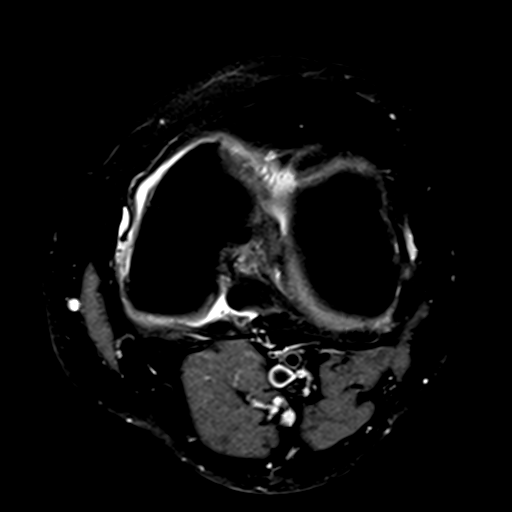
[im 23/41]
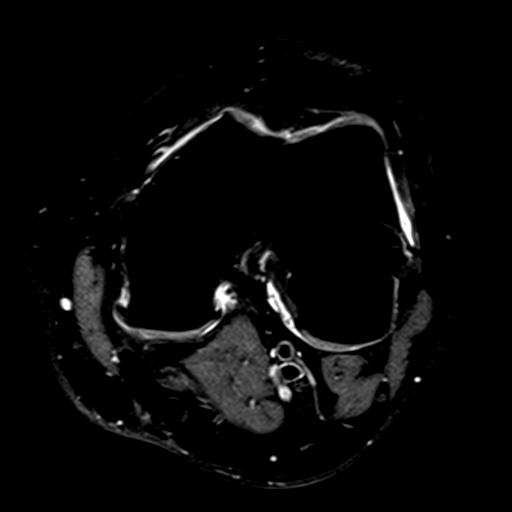
[im 29/41]
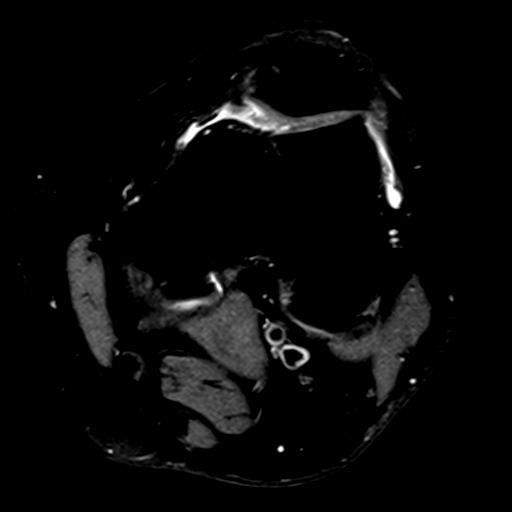
[im 35/41]
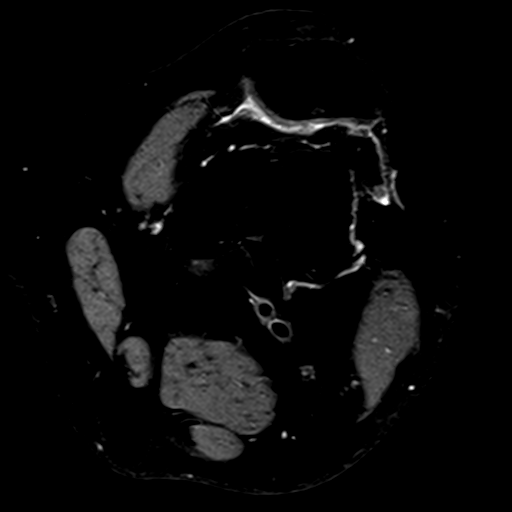
[im 41/41]
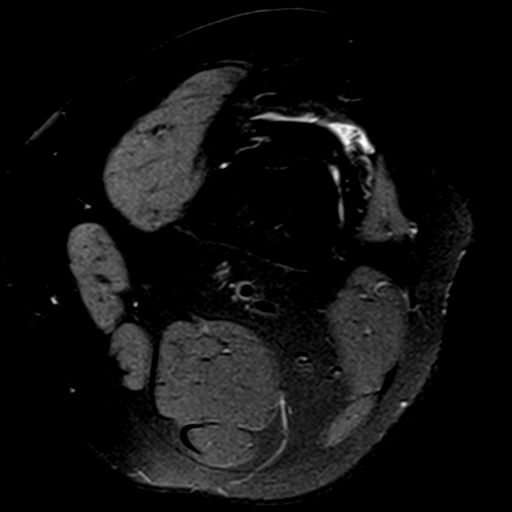

[Series 4: T1 · coronal · 3.0mm · 0.53mm/px · 5 of 36 slices shown]
[im 1/36]
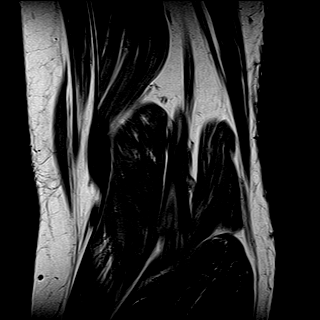
[im 6/36]
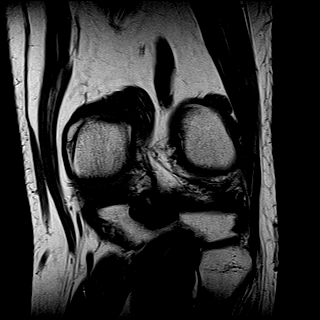
[im 12/36]
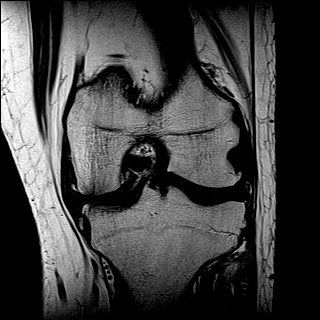
[im 18/36]
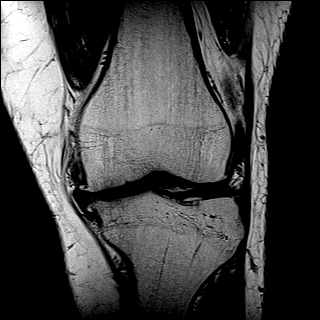
[im 24/36]
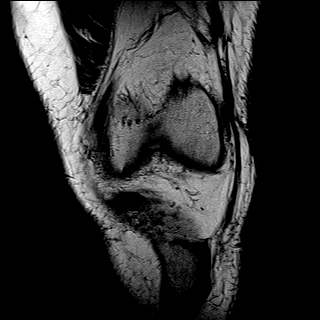

[Series 5: T2 fat-sat · coronal · 3.0mm · 0.53mm/px · 7 of 36 slices shown]
[im 1/36]
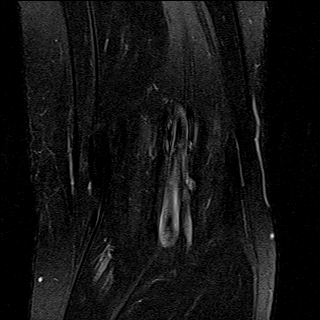
[im 6/36]
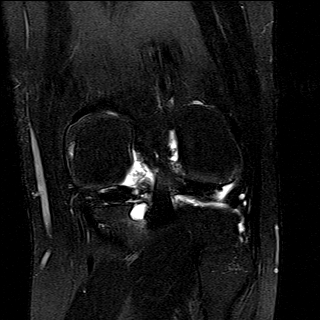
[im 12/36]
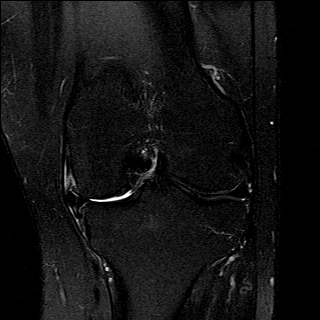
[im 18/36]
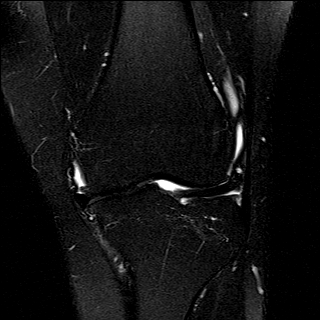
[im 24/36]
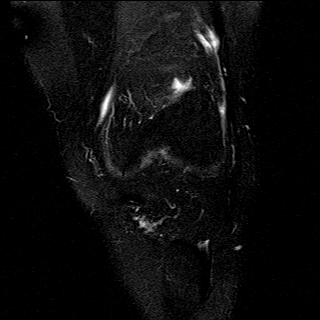
[im 30/36]
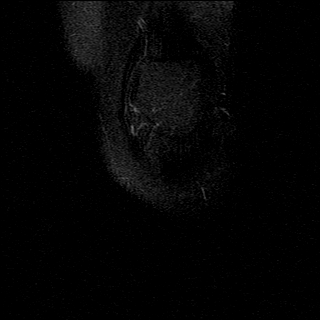
[im 36/36]
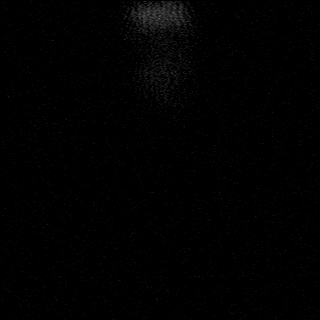

[Series 7: PD fat-sat · coronal · 3.0mm · 0.66mm/px · 7 of 36 slices shown (2 of 4)]
[im 1/36]
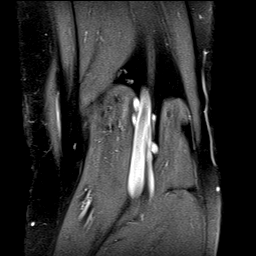
[im 6/36]
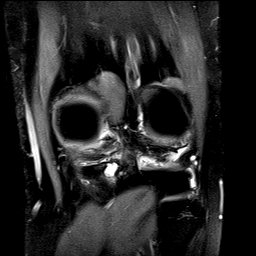
[im 12/36]
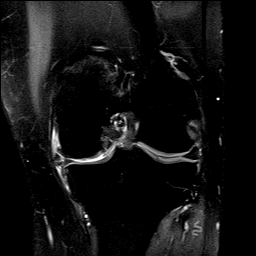
[im 18/36]
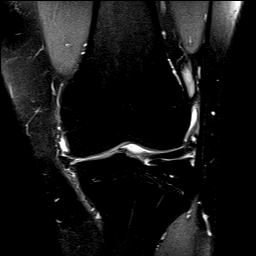
[im 24/36]
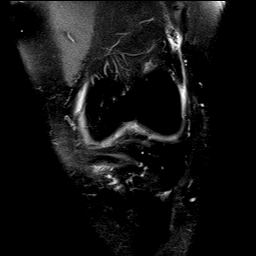
[im 30/36]
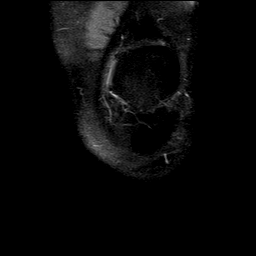
[im 36/36]
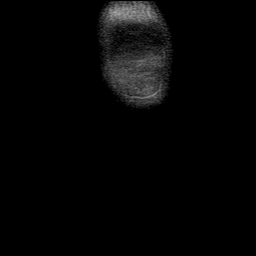

[Series 8: PD fat-sat · sagittal · 3.0mm · 0.66mm/px · 7 of 38 slices shown (3 of 4)]
[im 1/38]
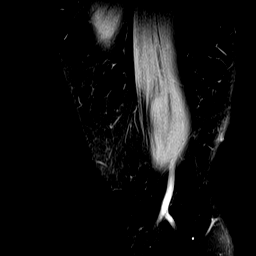
[im 7/38]
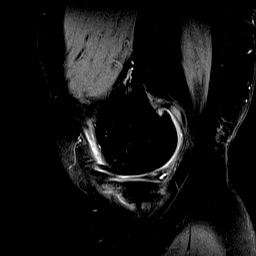
[im 13/38]
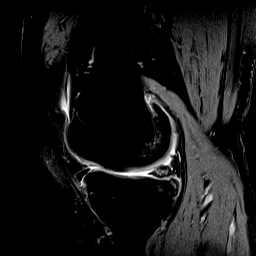
[im 19/38]
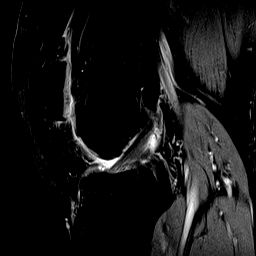
[im 25/38]
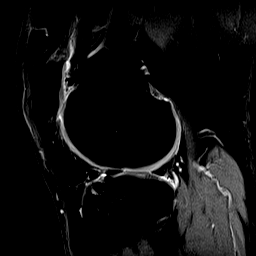
[im 31/38]
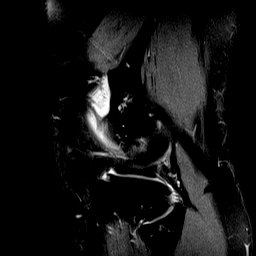
[im 38/38]
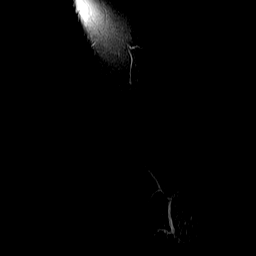

[Series 9: PD fat-sat · coronal · 2.0mm · 0.62mm/px · 4 of 19 slices shown (4 of 4)]
[im 1/19]
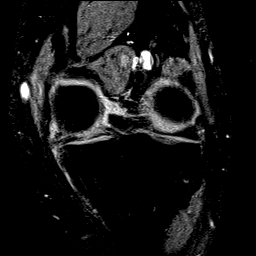
[im 7/19]
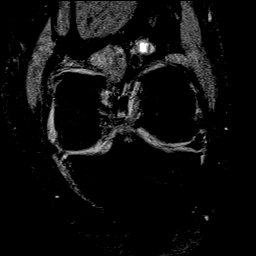
[im 13/19]
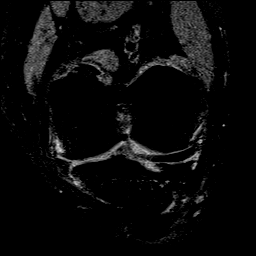
[im 19/19]
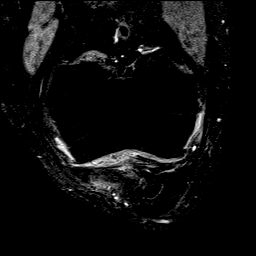

[38 of 40 positions shown; findings below may reference images not displayed]

FINDINGS: MENISCI

Medial meniscus: Complex tear of the posterior horn and midbody
medial meniscus with intersecting linear abnormal signal in the
posterior horn involving the periphery, superior surface, and
inferior surface.

Lateral meniscus:  Unremarkable

LIGAMENTS

Cruciates:  Unremarkable

Collaterals:  Mild MCL bursitis.

CARTILAGE

Patellofemoral: Multiple chondral fissures are present along the
lateral facet and posterior patellar ridge, worsened from prior.
Variable degrees of chondral thinning in the patellofemoral joint,
with areas of severe chondral thinning in the femoral trochlear
groove but generally mild to moderate chondral thinning along the
patella. Both appear significantly worsened from prior.

Medial:  Severe degenerative chondral thinning.  Marginal spurring.

Lateral:  Marginal spurring.

Joint:  Small knee effusion.  Thin medial plica.

Popliteal Fossa:  Unremarkable

Extensor Mechanism:  Unremarkable

Bones: Geode along the posteromedial tibial plateau adjacent to the
tibial spine. This has enlarged compared to the prior exam,
currently 1.1 cm in long axis.

Other: No supplemental non-categorized findings.
IMPRESSION: 1. Complex tear posterior horn and midbody medial meniscus with
intersecting linear abnormal signal involving the superior and
inferior surfaces.
2. Variable degree of osteoarthritis, most severe in the medial
compartment. Increased chondral fissuring in the patellofemoral
joint.
3. Small knee effusion.
4. Mild MCL bursitis.
5. A geode along the posteromedial tibial plateau has enlarged
compared to the 1843 exam.
